# Patient Record
Sex: Female | Born: 1985 | Race: White | Hispanic: No | Smoking: Former smoker
Health system: Southern US, Community
[De-identification: ages and names within clinical notes are randomized; demographics above are authoritative.]

## PROBLEM LIST (undated history)

## (undated) DIAGNOSIS — D649 Anemia, unspecified: Secondary | ICD-10-CM

## (undated) DIAGNOSIS — Z87442 Personal history of urinary calculi: Secondary | ICD-10-CM

## (undated) DIAGNOSIS — R030 Elevated blood-pressure reading, without diagnosis of hypertension: Secondary | ICD-10-CM

## (undated) DIAGNOSIS — B019 Varicella without complication: Secondary | ICD-10-CM

## (undated) DIAGNOSIS — I10 Essential (primary) hypertension: Secondary | ICD-10-CM

## (undated) DIAGNOSIS — F32A Depression, unspecified: Secondary | ICD-10-CM

## (undated) DIAGNOSIS — R51 Headache: Secondary | ICD-10-CM

## (undated) DIAGNOSIS — N8500 Endometrial hyperplasia, unspecified: Secondary | ICD-10-CM

## (undated) DIAGNOSIS — K219 Gastro-esophageal reflux disease without esophagitis: Secondary | ICD-10-CM

## (undated) DIAGNOSIS — Z87440 Personal history of urinary (tract) infections: Secondary | ICD-10-CM

## (undated) DIAGNOSIS — O24419 Gestational diabetes mellitus in pregnancy, unspecified control: Secondary | ICD-10-CM

## (undated) DIAGNOSIS — E039 Hypothyroidism, unspecified: Secondary | ICD-10-CM

## (undated) DIAGNOSIS — E079 Disorder of thyroid, unspecified: Secondary | ICD-10-CM

## (undated) DIAGNOSIS — N939 Abnormal uterine and vaginal bleeding, unspecified: Secondary | ICD-10-CM

## (undated) DIAGNOSIS — R519 Headache, unspecified: Secondary | ICD-10-CM

## (undated) HISTORY — PX: CHOLECYSTECTOMY: SHX55

## (undated) HISTORY — DX: Gestational diabetes mellitus in pregnancy, unspecified control: O24.419

## (undated) HISTORY — PX: TONSILLECTOMY: SUR1361

## (undated) HISTORY — DX: Personal history of urinary (tract) infections: Z87.440

## (undated) HISTORY — DX: Disorder of thyroid, unspecified: E07.9

## (undated) HISTORY — DX: Headache, unspecified: R51.9

## (undated) HISTORY — DX: Varicella without complication: B01.9

## (undated) HISTORY — DX: Elevated blood-pressure reading, without diagnosis of hypertension: R03.0

## (undated) HISTORY — DX: Gastro-esophageal reflux disease without esophagitis: K21.9

## (undated) HISTORY — DX: Personal history of urinary calculi: Z87.442

## (undated) HISTORY — DX: Headache: R51

---
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
## 2010-08-19 ENCOUNTER — Emergency Department: Payer: Self-pay | Admitting: Emergency Medicine

## 2012-10-28 ENCOUNTER — Emergency Department: Payer: Self-pay | Admitting: Emergency Medicine

## 2012-10-28 LAB — URINALYSIS, COMPLETE
Bacteria: NONE SEEN
Leukocyte Esterase: NEGATIVE
Nitrite: NEGATIVE
Protein: 100
RBC,UR: 2530 /HPF (ref 0–5)
Specific Gravity: 1.026 (ref 1.003–1.030)
WBC UR: 25 /HPF (ref 0–5)

## 2012-10-28 LAB — CBC
HGB: 12.6 g/dL (ref 12.0–16.0)
MCHC: 33.4 g/dL (ref 32.0–36.0)
Platelet: 289 10*3/uL (ref 150–440)
WBC: 14.3 10*3/uL — ABNORMAL HIGH (ref 3.6–11.0)

## 2012-10-29 LAB — WET PREP, GENITAL

## 2013-03-13 ENCOUNTER — Observation Stay: Payer: Self-pay | Admitting: Obstetrics and Gynecology

## 2013-03-13 LAB — BASIC METABOLIC PANEL
Anion Gap: 10 (ref 7–16)
BUN: 7 mg/dL (ref 7–18)
Chloride: 107 mmol/L (ref 98–107)
Creatinine: 0.4 mg/dL — ABNORMAL LOW (ref 0.60–1.30)
EGFR (African American): >60
EGFR (Non-African Amer.): >60
Glucose: 74 mg/dL (ref 65–99)
Osmolality: 274 (ref 275–301)
Potassium: 3.7 mmol/L (ref 3.5–5.1)
Sodium: 139 mmol/L (ref 136–145)

## 2013-03-13 LAB — PROTEIN / CREATININE RATIO, URINE
Protein, Random Urine: 34 mg/dL — ABNORMAL HIGH (ref 0–12)
Protein/Creat. Ratio: 284 mg/gCREAT — ABNORMAL HIGH (ref 0–200)

## 2013-03-13 LAB — HEMOGLOBIN: HGB: 10.8 g/dL — ABNORMAL LOW (ref 12.0–16.0)

## 2013-03-13 LAB — ALT: SGPT (ALT): 17 U/L (ref 12–78)

## 2013-03-13 LAB — WET PREP, GENITAL

## 2013-04-16 ENCOUNTER — Inpatient Hospital Stay: Payer: Self-pay | Admitting: Obstetrics and Gynecology

## 2013-04-16 LAB — CBC WITH DIFFERENTIAL/PLATELET
Basophil #: 0 10*3/uL (ref 0.0–0.1)
Basophil %: 0.2 %
Eosinophil %: 0.8 %
Lymphocyte #: 1.7 10*3/uL (ref 1.0–3.6)
Monocyte #: 0.6 x10 3/mm (ref 0.2–0.9)
Monocyte %: 5.3 %
Neutrophil %: 79.9 %
Platelet: 220 10*3/uL (ref 150–440)
RBC: 3.83 10*6/uL (ref 3.80–5.20)
RDW: 14.5 % (ref 11.5–14.5)

## 2013-04-16 LAB — SGOT (AST)(ARMC): SGOT(AST): 28 U/L (ref 15–37)

## 2013-04-16 LAB — PROTEIN / CREATININE RATIO, URINE
Creatinine, Urine: 137.8 mg/dL — ABNORMAL HIGH (ref 30.0–125.0)
Protein, Random Urine: 64 mg/dL — ABNORMAL HIGH (ref 0–12)
Protein/Creat. Ratio: 464 mg/gCREAT — ABNORMAL HIGH (ref 0–200)

## 2013-04-16 LAB — BASIC METABOLIC PANEL
BUN: 6 mg/dL — ABNORMAL LOW (ref 7–18)
Calcium, Total: 8.8 mg/dL (ref 8.5–10.1)
Chloride: 107 mmol/L (ref 98–107)
Co2: 21 mmol/L (ref 21–32)
Creatinine: 0.46 mg/dL — ABNORMAL LOW (ref 0.60–1.30)
EGFR (African American): >60
EGFR (Non-African Amer.): >60

## 2013-04-16 LAB — LACTATE DEHYDROGENASE: LDH: 174 U/L (ref 81–246)

## 2013-04-16 LAB — URIC ACID: Uric Acid: 3.9 mg/dL (ref 2.6–6.0)

## 2013-04-17 LAB — HEMATOCRIT: HCT: 27.7 % — ABNORMAL LOW (ref 35.0–47.0)

## 2013-04-18 LAB — POTASSIUM: Potassium: 3.3 mmol/L — ABNORMAL LOW (ref 3.5–5.1)

## 2015-03-06 NOTE — H&P (Signed)
L&D Evaluation:  History:  HPI 29 yo G1 at 7349w2d gestational age by LMP consistent with 11 week ultrasound her pregnancy has been largely uncomplicated.  She presents with concern for leakage of fluid.  She noted a "gush" of fluid after urinating on Friday (two days ago). She had a second episode of leaking later that day.  She had a third episode of clear fluid yesterday.  She has had no leaking in between these episodes.  She notes no vaginal bleeding. she feels good positive fetal movement.  Notes occasional, infrequent cramping.  She denies headache, visual disturbances, and RUQ pain. O+ / VZEQ / RPR NR / RI / GBS unk/ HIV neg   Patient's Medical History GERD   Patient's Surgical History 1) cholecystectomy, 2) "kidney surgery", 3) tonsillectomy   Medications Pre Natal Vitamins  prilosec, ranitidine   Allergies NKDA   Social History none   Family History Non-Contributory   ROS:  ROS All systems were reviewed.  HEENT, CNS, GI, GU, Respiratory, CV, Renal and Musculoskeletal systems were found to be normal., unless noted in HPI   Exam:  Vital Signs BP 144/82, 125/64, 124/67, 155/75   General no apparent distress   Mental Status clear   Chest clear   Heart normal sinus rhythm   Abdomen gravid, non-tender   Back no CVAT   Edema no edema   Pelvic no external lesions, cervix closed and thick   Mebranes Intact   FHT normal rate with no decels   FHT Description 130/+ accels/ no decels/ mod var   Ucx irregular, 1 q 10 min   Other Nitrazine = equivocal, Pool = neg, Fern = neg ROM+ = neg   Impression:  Impression reactive NST, No evidence of PPROM   Plan:  Plan UA, EFM/NST, monitor BP   Comments 1) No evidence of rupture of membranes.  Continue with usual precautions 2) Elevated BPs: Will send home with 24 hour urine total protein collection to be collected tomorrow. Follow up for BP check and returning urine collection to assess protein.  Preeclampsia precautions  given.   Follow Up Appointment need to schedule. 2 days   Labs:  Lab Results:  Misc Urine Chem:  18-May-14 13:25   Creatinine, Urine 119.8  Protein, Random Urine  34  Protein/Creat Ratio (comp)  284 (Result(s) reported on 13 Mar 2013 at 02:53PM.)  Routine Hem:  18-May-14 14:10   RBC (CBC)  3.78 (Result(s) reported on 13 Mar 2013 at 02:54PM.)  Hemoglobin (CBC)  10.8 (Result(s) reported on 13 Mar 2013 at 02:54PM.)  Hematocrit (CBC)  30.6 (Result(s) reported on 13 Mar 2013 at 02:54PM.)  Platelet Count (CBC) 198 (Result(s) reported on 13 Mar 2013 at 02:54PM.)   Electronic Signatures: Conard NovakJackson,  D (MD)  (Signed 18-May-14 15:04)  Authored: L&D Evaluation, Labs   Last Updated: 18-May-14 15:04 by Conard NovakJackson,  D (MD)

## 2015-03-06 NOTE — H&P (Signed)
L&D Evaluation:  History:  HPI 29 yo G1 with EDC=04/29/2013 at 2738wk1d gestational age by LMP=07/23/2012 and consistent with 11 week ultrasound presents with onset contractions at   She notes no vaginal bleeding or LOF and  she feels good positive fetal movement. [facility] in [facility] first trimester and [facility], vaginal bleeding in the early second trimester and  an antomy scan revealing a LLP that later resolved.  O+ / VZEQ / RPR NR / RI / GBS negative/ HIV neg   Presents with contractions   Patient's Medical History GERD   Patient's Surgical History 1) cholecystectomy, 2) "kidney surgery", 3) tonsillectomy   Medications Nexium   Allergies NKDA   Social History none   Family History Non-Contributory   ROS:  ROS see HPI   Exam:  Vital Signs stable  135/85, 128/83   Urine Protein not completed   General no apparent distress   Mental Status clear   Chest clear   Heart normal sinus rhythm, no murmur/gallop/rubs   Abdomen gravid, tender with contractions   Estimated Fetal Weight Average for gestational age, 917-7 1/2#   Fetal Position vtx   Edema 1+   Reflexes 2+   Clonus negative   Pelvic no external lesions, 4/90%/-1 per RN exam   Mebranes Intact   FHT normal rate with no decels, 135 with accels to 150s to 170   FHT Description mod variability   Ucx q3-5   Skin dry   Impression:  Impression IUP at 38 1/7 weeks in early/ active labor. Reactive FHR tracing   Plan:  Plan EFM/NST, monitor contractions and for cervical change, Stadol for pain. Can ambulate/be on birthing ball and monitor intermittently   Electronic Signatures: Trinna BalloonGutierrez,  L (CNM)  (Signed 21-Jun-14 16:55)  Authored: L&D Evaluation   Last Updated: 21-Jun-14 16:55 by Trinna BalloonGutierrez,  L (CNM)

## 2016-08-12 LAB — CBC AND DIFFERENTIAL
HEMATOCRIT: 43 % (ref 36–46)
Hemoglobin: 14.7 g/dL (ref 12.0–16.0)
NEUTROS ABS: 9 /uL
Platelets: 344 10*3/uL (ref 150–399)
WBC: 13.1 10*3/mL

## 2016-08-12 LAB — LIPID PANEL
CHOLESTEROL: 211 mg/dL — AB (ref 0–200)
HDL: 40 mg/dL (ref 35–70)
LDL Cholesterol: 103 mg/dL
TRIGLYCERIDES: 341 mg/dL — AB (ref 40–160)

## 2016-08-12 LAB — TSH: TSH: 10.65 u[IU]/mL — AB (ref 0.41–5.90)

## 2016-09-24 ENCOUNTER — Encounter: Payer: Self-pay | Admitting: Family Medicine

## 2016-09-24 ENCOUNTER — Other Ambulatory Visit: Payer: Self-pay | Admitting: Family Medicine

## 2016-09-24 ENCOUNTER — Ambulatory Visit (INDEPENDENT_AMBULATORY_CARE_PROVIDER_SITE_OTHER): Payer: BLUE CROSS/BLUE SHIELD | Admitting: Family Medicine

## 2016-09-24 VITALS — BP 138/79 | HR 115 | Temp 98.3°F | Resp 14 | Ht 62.0 in | Wt 223.2 lb

## 2016-09-24 DIAGNOSIS — E782 Mixed hyperlipidemia: Secondary | ICD-10-CM

## 2016-09-24 DIAGNOSIS — E039 Hypothyroidism, unspecified: Secondary | ICD-10-CM | POA: Diagnosis not present

## 2016-09-24 DIAGNOSIS — K219 Gastro-esophageal reflux disease without esophagitis: Secondary | ICD-10-CM | POA: Diagnosis not present

## 2016-09-24 DIAGNOSIS — E785 Hyperlipidemia, unspecified: Secondary | ICD-10-CM | POA: Insufficient documentation

## 2016-09-24 DIAGNOSIS — R5382 Chronic fatigue, unspecified: Secondary | ICD-10-CM

## 2016-09-24 DIAGNOSIS — R5383 Other fatigue: Secondary | ICD-10-CM | POA: Insufficient documentation

## 2016-09-24 LAB — T3, FREE: T3, Free: 3.7 pg/mL (ref 2.3–4.2)

## 2016-09-24 LAB — T4, FREE: FREE T4: 0.47 ng/dL — AB (ref 0.60–1.60)

## 2016-09-24 LAB — TSH: TSH: 12.12 u[IU]/mL — ABNORMAL HIGH (ref 0.35–4.50)

## 2016-09-24 MED ORDER — LEVOTHYROXINE SODIUM 75 MCG PO TABS: 75.0000 ug | ORAL_TABLET | Freq: Every day | ORAL | 0 refills | Status: DC

## 2016-09-24 MED ORDER — PANTOPRAZOLE SODIUM 40 MG PO TBEC: 40.0000 mg | DELAYED_RELEASE_TABLET | Freq: Every day | ORAL | 1 refills | Status: DC

## 2016-09-24 NOTE — Assessment & Plan Note (Signed)
New problem. Starting Protonix.

## 2016-09-24 NOTE — Patient Instructions (Signed)
Take the medication as prescribed.  Follow up in 6 weeks.  Take care  Dr.   

## 2016-09-24 NOTE — Assessment & Plan Note (Signed)
New problem. Likely secondary to morbid obesity and hypothyroidism. Low risk given age. We'll continue to monitor closely.

## 2016-09-24 NOTE — Progress Notes (Signed)
Subjective:  Patient ID: Desiree Santos, female    DOB: Mar 23, 1986  Age: 30 y.o. MRN: [id number]  CC: Establish care, Fatigue, Abnormal labs  HPI Desiree Ovenatalie D Apuzzo is a 30 y.o. female presents to the clinic today to establish care. Issues are below.  Fatigue  Patient has had ongoing, chronic fatigue.   She reports low energy.   She states that when she gets home from work she is not interested in doing anything. She does not even want to play with her son.   She denies symptoms of depression.  No known etiology per patient.  She reports physical inactivity and weight gain.   No known exacerbating or relieving factors.  GERD  Severe.  Associated vomiting.  Taking Prilosec without significant improvement.  No red flags.  No weight loss.  Would like to discuss treatment options today.   Abnormal labs, weight gain  Patient recently had laboratory studies done by her OB/GYN.  TSH was elevated at 10.65.  She returned endorses ongoing weight gain since birth of her child.  She was sent to me for further evaluation and treatment.  Hyperlipidemia  Noted on recent labs.  Currently untreated.  PMH, Surgical Hx, Family Hx, Social History reviewed and updated as below.  Past Medical History:  Diagnosis Date  . Chicken pox   . Elevated BP without diagnosis of hypertension   . GERD (gastroesophageal reflux disease)   . History of kidney stones   . History of UTI    Past Surgical History:  Procedure Laterality Date  . CHOLECYSTECTOMY    . TONSILLECTOMY      Family History  Problem Relation Age of Onset  . Hyperlipidemia Mother   . Hypertension Mother   . Heart disease Father   . Sudden Cardiac Death Father   . Hypertension Maternal Grandfather   . Breast cancer Paternal Grandmother   . Diabetes Paternal Grandfather     Social History  Substance Use Topics  . Smoking status: Current Every Day Smoker  . Smokeless tobacco: Never Used  . Alcohol use  No   Review of Systems  Constitutional: Positive for fatigue.       Weight gain.  Gastrointestinal: Positive for vomiting.       GERD.  Neurological: Positive for headaches.  All other systems reviewed and are negative.  Objective:   Today's Vitals: BP 138/79 (BP Location: Left Arm, Patient Position: Sitting, Cuff Size: Large)   Pulse (!) 115   Temp 98.3 F (36.8 C) (Oral)   Resp 14   Ht 5\' 2"  (1.575 m) Comment: with shoes  Wt 223 lb 4 oz (101.3 kg)   SpO2 97%   BMI 40.83 kg/m   Physical Exam  Constitutional: She is oriented to person, place, and time.  Obese female in NAD.  HENT:  Head: Normocephalic and atraumatic.  Mouth/Throat: Oropharynx is clear and moist.  Eyes: Conjunctivae are normal. No scleral icterus.  Neck: Neck supple. No thyromegaly present.  Cardiovascular: Regular rhythm.  Tachycardia present.   Pulmonary/Chest: Effort normal. She has no wheezes. She has no rales.  Abdominal: Soft. She exhibits no distension. There is no tenderness. There is no rebound and no guarding.  Musculoskeletal: Normal range of motion.  Neurological: She is alert and oriented to person, place, and time.  Skin: Skin is warm and dry. No rash noted.  Psychiatric: She has a normal mood and affect.  Vitals reviewed.  Assessment & Plan:   Problem List Items Addressed  This Visit    Hypothyroidism    New problem. TSH and free T3/free T4 today. If these laboratory studies are consistent with hypothyroidism, we'll proceed with treatment with Synthroid.      Relevant Orders   TSH   T4, free   T3, free   HLD (hyperlipidemia)    New problem. Likely secondary to morbid obesity and hypothyroidism. Low risk given age. We'll continue to monitor closely.      Gastroesophageal reflux disease without esophagitis    New problem. Starting Protonix.      Relevant Medications   pantoprazole (PROTONIX) 40 MG tablet   Fatigue - Primary    New problem. Likely secondary to morbid  obesity and hypothyroidism. Obtaining TSH, free T3, free T4 today.         Outpatient Encounter Prescriptions as of 09/24/2016  Medication Sig  . pantoprazole (PROTONIX) 40 MG tablet Take 1 tablet (40 mg total) by mouth daily.   No facility-administered encounter medications on file as of 09/24/2016.     Follow-up: Return in about 6 weeks (around 11/05/2016).  Everlene OtherJayce  DO [facility] [place]

## 2016-09-24 NOTE — Assessment & Plan Note (Signed)
New problem. TSH and free T3/free T4 today. If these laboratory studies are consistent with hypothyroidism, we'll proceed with treatment with Synthroid.

## 2016-09-24 NOTE — Assessment & Plan Note (Signed)
New problem. Likely secondary to morbid obesity and hypothyroidism. Obtaining TSH, free T3, free T4 today.

## 2016-11-05 ENCOUNTER — Other Ambulatory Visit: Payer: Self-pay | Admitting: Family Medicine

## 2016-11-05 ENCOUNTER — Encounter: Payer: Self-pay | Admitting: Family Medicine

## 2016-11-05 ENCOUNTER — Ambulatory Visit (INDEPENDENT_AMBULATORY_CARE_PROVIDER_SITE_OTHER): Payer: BLUE CROSS/BLUE SHIELD | Admitting: Family Medicine

## 2016-11-05 VITALS — BP 134/86 | HR 81 | Temp 98.5°F | Resp 14 | Wt 224.0 lb

## 2016-11-05 DIAGNOSIS — E039 Hypothyroidism, unspecified: Secondary | ICD-10-CM | POA: Diagnosis not present

## 2016-11-05 DIAGNOSIS — Z6841 Body Mass Index (BMI) 40.0 and over, adult: Secondary | ICD-10-CM

## 2016-11-05 DIAGNOSIS — R5382 Chronic fatigue, unspecified: Secondary | ICD-10-CM

## 2016-11-05 LAB — LDL CHOLESTEROL, DIRECT: Direct LDL: 123 mg/dL

## 2016-11-05 LAB — COMPREHENSIVE METABOLIC PANEL
ALK PHOS: 61 U/L (ref 39–117)
ALT: 33 U/L (ref 0–35)
AST: 36 U/L (ref 0–37)
Albumin: 4.5 g/dL (ref 3.5–5.2)
BUN: 12 mg/dL (ref 6–23)
CALCIUM: 9.8 mg/dL (ref 8.4–10.5)
CHLORIDE: 101 meq/L (ref 96–112)
CO2: 24 mEq/L (ref 19–32)
CREATININE: 0.55 mg/dL (ref 0.40–1.20)
GFR: 137.02 mL/min (ref >60.00)
Glucose, Bld: 88 mg/dL (ref 70–99)
POTASSIUM: 4.7 meq/L (ref 3.5–5.1)
Sodium: 139 mEq/L (ref 135–145)
TOTAL PROTEIN: 7.3 g/dL (ref 6.0–8.3)
Total Bilirubin: 0.4 mg/dL (ref 0.2–1.2)

## 2016-11-05 LAB — LIPID PANEL
CHOL/HDL RATIO: 5
CHOLESTEROL: 219 mg/dL — AB (ref 0–200)
HDL: 45.4 mg/dL (ref >39.00)
NonHDL: 173.47
TRIGLYCERIDES: 315 mg/dL — AB (ref 0.0–149.0)
VLDL: 63 mg/dL — ABNORMAL HIGH (ref 0.0–40.0)

## 2016-11-05 LAB — CBC
HEMATOCRIT: 41.1 % (ref 36.0–46.0)
Hemoglobin: 14.1 g/dL (ref 12.0–15.0)
MCHC: 34.2 g/dL (ref 30.0–36.0)
MCV: 88.3 fl (ref 78.0–100.0)
Platelets: 282 10*3/uL (ref 150.0–400.0)
RBC: 4.66 Mil/uL (ref 3.87–5.11)
RDW: 13.3 % (ref 11.5–15.5)
WBC: 11.3 10*3/uL — AB (ref 4.0–10.5)

## 2016-11-05 LAB — HEMOGLOBIN A1C: HEMOGLOBIN A1C: 5.6 % (ref 4.6–6.5)

## 2016-11-05 LAB — TSH: TSH: 9.99 u[IU]/mL — ABNORMAL HIGH (ref 0.35–4.50)

## 2016-11-05 MED ORDER — LEVOTHYROXINE SODIUM 100 MCG PO TABS: 100.0000 ug | ORAL_TABLET | Freq: Every day | ORAL | 0 refills | Status: DC

## 2016-11-05 NOTE — Assessment & Plan Note (Signed)
Not well controlled. TSH today. Will titrate synthroid based on TSH.

## 2016-11-05 NOTE — Progress Notes (Signed)
Subjective:  Patient ID: Desiree Santos, female    DOB: [birth year]  Age: 31 y.o. MRN: [id number]  CC: Follow up  HPI:  31 year old female with morbid obesity, chronic fatigue, GERD, hypothyroidism presents for follow-up.  Hypothyroidism  Endorses compliance with Synthroid.  She has not noticed any improvement following treatment.  Needs TSH today.  Chronic fatigue  Continues to have severe, chronic fatigue. Worsening.  She states that she has a history of postpartum depression but does not feel depressed.  She states that she does not sleep well as she wakes up frequently at night to urinate. She does snore.  She has not noticed any improvement in her fatigue since being treated for hypothyroidism.  She has gained a significant amount of weight and is physically inactive.  Will discuss further workup and etiologies today.  Social Hx   Social History   Social History  . Marital status: Single    Spouse name: N/A  . Number of children: N/A  . Years of education: N/A   Social History Main Topics  . Smoking status: Current Every Day Smoker  . Smokeless tobacco: Never Used  . Alcohol use No  . Drug use: No  . Sexual activity: Yes    Partners: Male   Other Topics Concern  . None   Social History Narrative  . None    Review of Systems  Constitutional: Positive for fatigue.  Respiratory: Negative.   Cardiovascular: Negative.    Objective:  BP 134/86   Pulse 81   Temp 98.5 F (36.9 C) (Oral)   Resp 14   Wt 224 lb (101.6 kg)   SpO2 97%   BMI 40.97 kg/m   BP/Weight 11/05/2016 09/24/2016  Systolic BP 134 138  Diastolic BP 86 79  Wt. (Lbs) 224 223.25  BMI 40.97 40.83    Physical Exam  Constitutional: She is oriented to person, place, and time. She appears well-developed. No distress.  Cardiovascular: Normal rate and regular rhythm.   Pulmonary/Chest: Effort normal and breath sounds normal.  Neurological: She is alert and oriented to person,  place, and time.  Psychiatric: She has a normal mood and affect.  Vitals reviewed.   Lab Results  Component Value Date   WBC 13.1 08/12/2016   HGB 14.7 08/12/2016   HCT 43 08/12/2016   PLT 344 08/12/2016   GLUCOSE 77 04/16/2013   CHOL 211 (A) 08/12/2016   TRIG 341 (A) 08/12/2016   HDL 40 08/12/2016   LDLCALC 103 08/12/2016   ALT 17 03/13/2013   AST 28 04/16/2013   NA 142 04/16/2013   K 3.3 (L) 04/18/2013   CL 107 04/16/2013   CREATININE 0.46 (L) 04/16/2013   BUN 6 (L) 04/16/2013   CO2 21 04/16/2013   TSH 12.12 (H) 09/24/2016    Assessment & Plan:   Problem List Items Addressed This Visit    Morbid obesity with BMI of 40.0-44.9, adult (HCC)   Relevant Orders   Lipid panel   Hypothyroidism - Primary    Not well controlled. TSH today. Will titrate synthroid based on TSH.      Relevant Orders   TSH   Fatigue    Established problem, worsening. DDX - Hypothyroidism, Depression, OSA, Deconditioning/Morbid obesity. Obtaining labs today. Will likely need sleep study if labs are stable/hypothyroidism controlled. Advised healthy diet and daily physical activity.      Relevant Orders   CBC   Hemoglobin A1c   Comprehensive metabolic panel  Follow-up: Pending labs   [facility] DO [facility]

## 2016-11-05 NOTE — Assessment & Plan Note (Signed)
Established problem, worsening. DDX - Hypothyroidism, Depression, OSA, Deconditioning/Morbid obesity. Obtaining labs today. Will likely need sleep study if labs are stable/hypothyroidism controlled. Advised healthy diet and daily physical activity.

## 2016-11-05 NOTE — Patient Instructions (Signed)
We will call with your lab results.  Follow up pending your lab results.  Dietary changes and exercise as we discussed.  Take care  Dr. Adriana Simasook

## 2016-11-05 NOTE — Progress Notes (Signed)
Pre visit review using our clinic review tool, if applicable. No additional management support is needed unless otherwise documented below in the visit note. 

## 2016-11-06 ENCOUNTER — Telehealth: Payer: Self-pay | Admitting: Family Medicine

## 2016-11-06 NOTE — Telephone Encounter (Signed)
Pt called and stated that she had a question for you. Thank you!  Call pt @ [phone]

## 2016-11-06 NOTE — Telephone Encounter (Signed)
Likely related to weight and lack of activity. Elevation and physical activity.

## 2016-11-06 NOTE — Telephone Encounter (Signed)
Pt was called and given Dr.Cook's comments. Pt gave verbal understanding.

## 2016-11-06 NOTE — Telephone Encounter (Signed)
Pt stated that her legs are swelling and staying indented when she pushes down on them.

## 2017-02-03 ENCOUNTER — Encounter: Payer: Self-pay | Admitting: Family Medicine

## 2017-02-03 ENCOUNTER — Ambulatory Visit (INDEPENDENT_AMBULATORY_CARE_PROVIDER_SITE_OTHER): Payer: BLUE CROSS/BLUE SHIELD | Admitting: Family Medicine

## 2017-02-03 VITALS — BP 124/80 | HR 84 | Temp 97.8°F | Wt 226.4 lb

## 2017-02-03 DIAGNOSIS — R51 Headache: Secondary | ICD-10-CM | POA: Diagnosis not present

## 2017-02-03 DIAGNOSIS — R519 Headache, unspecified: Secondary | ICD-10-CM

## 2017-02-03 MED ORDER — DEXAMETHASONE SODIUM PHOSPHATE 10 MG/ML IJ SOLN
10.0000 mg | Freq: Once | INTRAMUSCULAR | Status: AC
  Administered 2017-02-03: 10 mg via INTRAMUSCULAR

## 2017-02-03 NOTE — Assessment & Plan Note (Signed)
New problem. Concern for pseudotumor cerebri given history, age, obesity. Treating with IM Decadron today and sending to neuro for eval.

## 2017-02-03 NOTE — Patient Instructions (Signed)
We will call with neuro referral.  Take care  Dr. Adriana Simas

## 2017-02-03 NOTE — Progress Notes (Signed)
Pre visit review using our clinic review tool, if applicable. No additional management support is needed unless otherwise documented below in the visit note. 

## 2017-02-03 NOTE — Progress Notes (Signed)
   Subjective:  Patient ID: Desiree Santos, female    DOB: [birth year]  Age: 31 y.o. MRN: [id number]  CC: Headache  HPI:  31 year old female presents with complaints of headache.  Patient reports she's had several severe headaches over the past 2 months. Patient states that her headaches are located in the temporal and frontal regions. Bilateral. Described as pulsing/throbbing. No associated photophobia. She does report some visual disturbance. She states that she feels that her left eye is not moving appropriately. She reports associated nausea. No vomiting. Headaches start mild and then progressed to severe, 10/10. She's been using ibuprofen with no improvement. No history of migraine. She states that she has never had this previously.  Social Hx   Social History   Social History  . Marital status: Single    Spouse name: N/A  . Number of children: N/A  . Years of education: N/A   Social History Main Topics  . Smoking status: Current Every Day Smoker  . Smokeless tobacco: Never Used  . Alcohol use No  . Drug use: No  . Sexual activity: Yes    Partners: Male   Other Topics Concern  . None   Social History Narrative  . None    Review of Systems  Eyes: Positive for visual disturbance.  Neurological: Positive for headaches.   Objective:  BP 124/80   Pulse 84   Temp 97.8 F (36.6 C) (Oral)   Wt 226 lb 6 oz (102.7 kg)   SpO2 99%   BMI 41.40 kg/m   BP/Weight 02/03/2017 11/05/2016 09/24/2016  Systolic BP 124 134 138  Diastolic BP 80 86 79  Wt. (Lbs) 226.38 224 223.25  BMI 41.4 40.97 40.83   Physical Exam  Constitutional: She is oriented to person, place, and time. She appears well-developed. No distress.  HENT:  Head: Normocephalic and atraumatic.  Eyes: Conjunctivae are normal.  I could not appreciate any papilledema on fundoscopic edema.   Cardiovascular: Normal rate and regular rhythm.   Neurological: She is alert and oriented to person, place, and time. No  cranial nerve deficit.  Normal muscle strength.  Psychiatric: She has a normal mood and affect.  Vitals reviewed.   Lab Results  Component Value Date   WBC 11.3 (H) 11/05/2016   HGB 14.1 11/05/2016   HCT 41.1 11/05/2016   PLT 282.0 11/05/2016   GLUCOSE 88 11/05/2016   CHOL 219 (H) 11/05/2016   TRIG 315.0 (H) 11/05/2016   HDL 45.40 11/05/2016   LDLDIRECT 123.0 11/05/2016   LDLCALC 103 08/12/2016   ALT 33 11/05/2016   AST 36 11/05/2016   NA 139 11/05/2016   K 4.7 11/05/2016   CL 101 11/05/2016   CREATININE 0.55 11/05/2016   BUN 12 11/05/2016   CO2 24 11/05/2016   TSH 9.99 (H) 11/05/2016   HGBA1C 5.6 11/05/2016    Assessment & Plan:   Problem List Items Addressed This Visit    Nonintractable headache - Primary    New problem. Concern for pseudotumor cerebri given history, age, obesity. Treating with IM Decadron today and sending to neuro for eval.       Relevant Medications   dexamethasone (DECADRON) injection 10 mg (Completed)     Meds ordered this encounter  Medications  . dexamethasone (DECADRON) injection 10 mg   Follow-up: PRN  Everlene Other DO [facility]

## 2017-02-05 ENCOUNTER — Ambulatory Visit (INDEPENDENT_AMBULATORY_CARE_PROVIDER_SITE_OTHER): Payer: BLUE CROSS/BLUE SHIELD | Admitting: Neurology

## 2017-02-05 ENCOUNTER — Encounter: Payer: Self-pay | Admitting: Neurology

## 2017-02-05 DIAGNOSIS — G43709 Chronic migraine without aura, not intractable, without status migrainosus: Secondary | ICD-10-CM | POA: Insufficient documentation

## 2017-02-05 DIAGNOSIS — IMO0002 Reserved for concepts with insufficient information to code with codable children: Secondary | ICD-10-CM

## 2017-02-05 MED ORDER — SUMATRIPTAN SUCCINATE 100 MG PO TABS: 100.0000 mg | ORAL_TABLET | Freq: Once | ORAL | 6 refills | Status: DC | PRN

## 2017-02-05 MED ORDER — PROMETHAZINE HCL 25 MG PO TABS: 25.0000 mg | ORAL_TABLET | Freq: Four times a day (QID) | ORAL | 6 refills | Status: DC | PRN

## 2017-02-05 NOTE — Progress Notes (Signed)
PATIENT: Desiree Santos DOB: 12-23-1985  Chief Complaint  Patient presents with  . Headache    She is here with her mother, Toniann Fail.  Reports onset of severe headaches two months ago.  Pain can last up to 4-5 days.  At times, she has nausea and vision disturbance in her left eye.  She is using ibuprofen without relief.   Marland Kitchen PCP    Tommie Sams, DO     HISTORICAL  Desiree Santos is a 31 years old right-handed female, accompanied by her mother when the, seen in refer by her primary care physician Dr. Tommie Sams for evaluation of headache, initial evaluation was on February 05 2017.  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 She has 11 years old son.  She noticed increased headaches since February 2018, bilateral frontal retro-orbital area severe pounding headaches, with associated light noise sensitivity, nauseous, she had 4 major headaches since February, oftentimes preceded by left visual distortion, lasting for a few minutes,  She did had a 60 pound weight gain over the past 3 years, she denies significant visual change, previously around her menstruation, she would have similar but much milder shorter lasting headaches   REVIEW OF SYSTEMS: Full 14 system review of systems performed and notable only for headache, high pain  ALLERGIES: No Known Allergies  HOME MEDICATIONS: Current Outpatient Prescriptions  Medication Sig Dispense Refill  . levothyroxine (SYNTHROID, LEVOTHROID) 100 MCG tablet Take 1 tablet (100 mcg total) by mouth daily. 60 tablet 0  . pantoprazole (PROTONIX) 40 MG tablet Take 1 tablet (40 mg total) by mouth daily. 90 tablet 1   No  current facility-administered medications for this visit.     PAST MEDICAL HISTORY: Past Medical History:  Diagnosis Date  . Chicken pox   . Elevated BP without diagnosis of hypertension   . GERD (gastroesophageal reflux disease)   . Headache   . History of kidney stones   . History of UTI   . Thyroid disease     PAST SURGICAL HISTORY: Past Surgical History:  Procedure Laterality Date  . CHOLECYSTECTOMY    . KIDNEY SURGERY  2002  . TONSILLECTOMY      FAMILY HISTORY: Family History  Problem Relation Age of Onset  . Hyperlipidemia Mother   . Hypertension Mother   . Heart disease Father   . Sudden Cardiac Death Father   . Hypertension Maternal Grandfather   . Breast cancer Paternal Grandmother   . Diabetes Paternal Grandfather   . Dementia Maternal Grandmother     SOCIAL HISTORY:  Social History   Social History  . Marital status: Single    Spouse name: N/A  . Number of children: 1  . Years of education: HS   Occupational History  . Lab [employer]   Social History Main Topics  . Smoking status: Current Every Day Smoker    Types: Cigarettes  . Smokeless tobacco: Never Used     Comment: 1-3 cigarettes per day  . Alcohol use No  . Drug use: No  . Sexual activity: Yes    Partners: Male   Other Topics Concern  . Not on file  Social History Narrative   Lives at home with child.   Right-handed.   Occasional caffeine use.     PHYSICAL EXAM   Vitals:   02/05/17 0825  Weight: 223 lb 12 oz (101.5 kg)  Height:  (1.575 m)    Not recorded      Body mass index is 40.92 kg/m.  PHYSICAL EXAMNIATION:  Gen: NAD, conversant, well nourised, obese, well groomed                     Cardiovascular: Regular rate rhythm, no peripheral edema, warm, nontender. Eyes: Conjunctivae clear without exudates or hemorrhage Neck: Supple, no carotid bruits. Pulmonary: Clear to auscultation bilaterally   NEUROLOGICAL EXAM:  MENTAL  STATUS: Speech:    Speech is normal; fluent and spontaneous with normal comprehension.  Cognition:     Orientation to time, place and person     Normal recent and remote memory     Normal Attention span and concentration     Normal Language, naming, repeating,spontaneous speech     Fund of knowledge   CRANIAL NERVES: CN II: Visual fields are full to confrontation. Fundoscopic exam is normal with sharp discs and no vascular changes. Pupils are round equal and briskly reactive to light. CN III, IV, VI: extraocular movement are normal. No ptosis. CN V: Facial sensation is intact to pinprick in all 3 divisions bilaterally. Corneal responses are intact.  CN VII: Face is symmetric with normal eye closure and smile. CN VIII: Hearing is normal to rubbing fingers CN IX, X: Palate elevates symmetrically. Phonation is normal. CN XI: Head turning and shoulder shrug are intact CN XII: Tongue is midline with normal movements and no atrophy.  MOTOR: There is no pronator drift of out-stretched arms. Muscle bulk and tone are normal. Muscle strength is normal.  REFLEXES: Reflexes are 2+ and symmetric at the biceps, triceps, knees, and ankles. Plantar responses are flexor.  SENSORY: Intact to light touch, pinprick, positional sensation and vibratory sensation are intact in fingers and toes.  COORDINATION: Rapid alternating movements and fine finger movements are intact. There is no dysmetria on finger-to-nose and heel-knee-shin.    GAIT/STANCE: Posture is normal. Gait is steady with normal steps, base, arm swing, and turning. Heel and toe walking are normal. Tandem gait is normal.  Romberg is absent.   DIAGNOSTIC DATA (LABS, IMAGING, TESTING) - I reviewed patient records, labs, notes, testing and imaging myself where available.   ASSESSMENT AND PLAN  Desiree Santos is a 31 y.o. female   Chronic migraine  Imitrex 100 mg, plus Phenergan as needed  Levert Feinstein, M.D. Ph.D.  [facility] [address], [address] [place], [place] [address] Ph: [phone] Fax: [phone]  CC: Tommie Sams, DO

## 2017-05-21 ENCOUNTER — Ambulatory Visit: Payer: BLUE CROSS/BLUE SHIELD | Admitting: Neurology

## 2019-11-15 ENCOUNTER — Emergency Department: Payer: BC Managed Care – PPO

## 2019-11-15 ENCOUNTER — Other Ambulatory Visit: Payer: Self-pay

## 2019-11-15 ENCOUNTER — Emergency Department
Admission: EM | Admit: 2019-11-15 | Discharge: 2019-11-15 | Disposition: A | Discharge status: Home / Self Care | Payer: BC Managed Care – PPO | Attending: Student | Admitting: Student

## 2019-11-15 ENCOUNTER — Encounter: Payer: Self-pay | Admitting: Emergency Medicine

## 2019-11-15 DIAGNOSIS — Z87891 Personal history of nicotine dependence: Secondary | ICD-10-CM | POA: Diagnosis not present

## 2019-11-15 DIAGNOSIS — E039 Hypothyroidism, unspecified: Secondary | ICD-10-CM | POA: Diagnosis not present

## 2019-11-15 DIAGNOSIS — Z79899 Other long term (current) drug therapy: Secondary | ICD-10-CM | POA: Diagnosis not present

## 2019-11-15 DIAGNOSIS — R220 Localized swelling, mass and lump, head: Secondary | ICD-10-CM | POA: Diagnosis present

## 2019-11-15 DIAGNOSIS — J3489 Other specified disorders of nose and nasal sinuses: Secondary | ICD-10-CM | POA: Insufficient documentation

## 2019-11-15 DIAGNOSIS — L03211 Cellulitis of face: Secondary | ICD-10-CM

## 2019-11-15 LAB — CBC WITH DIFFERENTIAL/PLATELET
Abs Immature Granulocytes: 0.07 10*3/uL (ref 0.00–0.07)
Basophils Absolute: 0 10*3/uL (ref 0.0–0.1)
Basophils Relative: 1 %
Eosinophils Absolute: 0.2 10*3/uL (ref 0.0–0.5)
Eosinophils Relative: 3 %
HCT: 38.2 % (ref 36.0–46.0)
Hemoglobin: 13 g/dL (ref 12.0–15.0)
Immature Granulocytes: 1 %
Lymphocytes Relative: 32 %
Lymphs Abs: 2.6 10*3/uL (ref 0.7–4.0)
MCH: 29.2 pg (ref 26.0–34.0)
MCHC: 34 g/dL (ref 30.0–36.0)
MCV: 85.8 fL (ref 80.0–100.0)
Monocytes Absolute: 0.4 10*3/uL (ref 0.1–1.0)
Monocytes Relative: 5 %
Neutro Abs: 4.8 10*3/uL (ref 1.7–7.7)
Neutrophils Relative %: 58 %
Platelets: 209 10*3/uL (ref 150–400)
RBC: 4.45 MIL/uL (ref 3.87–5.11)
RDW: 12.7 % (ref 11.5–15.5)
WBC: 8.1 10*3/uL (ref 4.0–10.5)
nRBC: 0 % (ref 0.0–0.2)

## 2019-11-15 LAB — BASIC METABOLIC PANEL
Anion gap: 11 (ref 5–15)
BUN: 13 mg/dL (ref 6–20)
CO2: 24 mmol/L (ref 22–32)
Calcium: 9.4 mg/dL (ref 8.9–10.3)
Chloride: 103 mmol/L (ref 98–111)
Creatinine, Ser: 0.7 mg/dL (ref 0.44–1.00)
GFR calc Af Amer: >60 mL/min (ref >60)
GFR calc non Af Amer: >60 mL/min (ref >60)
Glucose, Bld: 89 mg/dL (ref 70–99)
Potassium: 4 mmol/L (ref 3.5–5.1)
Sodium: 138 mmol/L (ref 135–145)

## 2019-11-15 LAB — POCT PREGNANCY, URINE: Preg Test, Ur: NEGATIVE

## 2019-11-15 MED ORDER — CETIRIZINE HCL 10 MG PO TABS: 10.0000 mg | ORAL_TABLET | Freq: Every day | ORAL | 0 refills | Status: DC

## 2019-11-15 MED ORDER — DIPHENHYDRAMINE HCL 50 MG/ML IJ SOLN
25.0000 mg | Freq: Once | INTRAMUSCULAR | Status: AC
  Administered 2019-11-15: 20:00:00 25 mg via INTRAVENOUS
  Filled 2019-11-15: qty 1

## 2019-11-15 MED ORDER — GADOBUTROL 1 MMOL/ML IV SOLN
10.0000 mL | Freq: Once | INTRAVENOUS | Status: AC | PRN
  Administered 2019-11-15: 22:00:00 10 mL via INTRAVENOUS
  Filled 2019-11-15: qty 10

## 2019-11-15 MED ORDER — METHYLPREDNISOLONE SODIUM SUCC 125 MG IJ SOLR
125.0000 mg | Freq: Once | INTRAMUSCULAR | Status: AC
  Administered 2019-11-15: 20:00:00 125 mg via INTRAVENOUS
  Filled 2019-11-15: qty 2

## 2019-11-15 MED ORDER — ACETAMINOPHEN 500 MG PO TABS
1000.0000 mg | ORAL_TABLET | Freq: Once | ORAL | Status: AC
  Administered 2019-11-15: 20:00:00 1000 mg via ORAL
  Filled 2019-11-15: qty 2

## 2019-11-15 MED ORDER — SODIUM CHLORIDE 0.9 % IV BOLUS
1000.0000 mL | Freq: Once | INTRAVENOUS | Status: AC
  Administered 2019-11-15: 1000 mL via INTRAVENOUS

## 2019-11-15 MED ORDER — FENTANYL CITRATE (PF) 100 MCG/2ML IJ SOLN
25.0000 ug | Freq: Once | INTRAMUSCULAR | Status: AC
  Administered 2019-11-15: 20:00:00 25 ug via INTRAVENOUS
  Filled 2019-11-15: qty 2

## 2019-11-15 MED ORDER — FAMOTIDINE IN NACL 20-0.9 MG/50ML-% IV SOLN
20.0000 mg | Freq: Once | INTRAVENOUS | Status: AC
  Administered 2019-11-15: 20:00:00 20 mg via INTRAVENOUS
  Filled 2019-11-15: qty 50

## 2019-11-15 MED ORDER — DOXYCYCLINE HYCLATE 100 MG PO CAPS: 100.0000 mg | ORAL_CAPSULE | Freq: Two times a day (BID) | ORAL | 0 refills | Status: AC

## 2019-11-15 MED ORDER — IOHEXOL 300 MG/ML  SOLN
75.0000 mL | Freq: Once | INTRAMUSCULAR | Status: AC | PRN
  Administered 2019-11-15: 75 mL via INTRAVENOUS
  Filled 2019-11-15: qty 75

## 2019-11-15 NOTE — ED Triage Notes (Signed)
FIRST NURSE NOTE- here for facial swelling. Small scabbed wound to forehead. NAD

## 2019-11-15 NOTE — ED Provider Notes (Signed)
[facility] Emergency Department Provider Note  ____________________________________________   First MD Initiated Contact with Patient 11/15/19 1708     (approximate)  I have reviewed the triage vital signs and the nursing notes.  History  Chief Complaint Cellulitis    HPI Desiree Santos is a 34 y.o. female who presents emergency department for an area of scabbing and redness to her forehead, accompanied by facial and neck swelling.  Symptoms initially started about 1 week ago and were limited to a small area of suspected cellulitis located between her eyebrows. Started as a "dry patch" nickel sized area, then developed redness/scabbing, which has now progressed slightly in size.  The central portion is dark and scabbed over. She denies any trauma or picking to the area. 1 or 2 days after developing this area of infection she developed submandibular swelling.  No associated erythema or warmth to this area.  No difficulty speaking, swallowing, or breathing.  Thereafter, she developed bilateral periorbital edema and swelling, mild in severity.   She saw her PCP for these symptoms yesterday, was started on Keflex and a topical medication.  She specifically clarifies that her facial swelling was present prior to initiating this topical medication.  She denies any new facial contact exposures that she knows of.  She reports an associated aching headache (moderate in severity, no radiation, no alleviating/aggravating components). No blurry vision or pain with eye movement.  No history of similar symptoms.   Past Medical Hx Past Medical History:  Diagnosis Date  . Chicken pox   . Elevated BP without diagnosis of hypertension   . GERD (gastroesophageal reflux disease)   . Headache   . History of kidney stones   . History of UTI   . Thyroid disease     Problem List Patient Active Problem List   Diagnosis Date Noted  . Chronic migraine 02/05/2017  .  Nonintractable headache 02/03/2017  . Morbid obesity with BMI of 40.0-44.9, adult ([place]) 11/05/2016  . Fatigue 09/24/2016  . Hypothyroidism 09/24/2016  . Gastroesophageal reflux disease without esophagitis 09/24/2016  . HLD (hyperlipidemia) 09/24/2016    Past Surgical Hx Past Surgical History:  Procedure Laterality Date  . CHOLECYSTECTOMY    . KIDNEY SURGERY  2002  . TONSILLECTOMY      Medications Prior to Admission medications   Medication Sig Start Date End Date Taking? Authorizing Provider  levothyroxine (SYNTHROID, LEVOTHROID) 100 MCG tablet Take 1 tablet (100 mcg total) by mouth daily. 11/05/16   Coral Spikes, DO  pantoprazole (PROTONIX) 40 MG tablet Take 1 tablet (40 mg total) by mouth daily. 09/24/16   Coral Spikes, DO    Allergies Patient has no known allergies.  Family Hx Family History  Problem Relation Age of Onset  . Hyperlipidemia Mother   . Hypertension Mother   . Heart disease Father   . Sudden Cardiac Death Father   . Hypertension Maternal Grandfather   . Breast cancer Paternal Grandmother   . Diabetes Paternal Grandfather   . Dementia Maternal Grandmother     Social Hx Social History   Tobacco Use  . Smoking status: Former Smoker    Types: Cigarettes  . Smokeless tobacco: Never Used  Substance Use Topics  . Alcohol use: No  . Drug use: No     Review of Systems  Constitutional: Negative for fever, chills. Eyes: Negative for visual changes. HENT: + facial swelling Cardiovascular: Negative for chest pain. Respiratory: Negative for shortness of breath. Gastrointestinal: Negative  for nausea, vomiting.  Genitourinary: Negative for dysuria. Musculoskeletal: Negative for leg swelling. Skin: + skin lesions, cellulitis Neurological: Negative for for headaches.   Physical Exam  Vital Signs: ED Triage Vitals  Enc Vitals Group     BP 11/15/19 1620 127/84     Pulse Rate 11/15/19 1620 74     Resp 11/15/19 1620 17     Temp 11/15/19 1620 98.6  F (37 C)     Temp Source 11/15/19 1620 Oral     SpO2 11/15/19 1620 96 %     Weight 11/15/19 1620 218 lb (98.9 kg)     Height 11/15/19 1620 5' (1.524 m)     Head Circumference --      Peak Flow --      Pain Score 11/15/19 1628 10     Pain Loc --      Pain Edu? --      Excl. in GC? --     Constitutional: Alert and oriented.  Head/Face: Nickel sized area of erythema/irritation between the eyebrows. Central area is darkened and scabbed. Preauricular/parotid lymphadenopathy. CN II-XII in tact.  Eyes: Conjunctivae clear. Sclera anicteric.  Bilateral periorbital edema, mild.  Associated, perhaps related, dependent edema of the midface area. PERRL.  EOMI.  No pain with extraocular movements.  No proptosis. Nose: No congestion. No rhinorrhea. Nasal congestion. Sinus tender. Mouth/Throat: Wearing mask.  No voice changes. Neck: Question submandibular lymphadenopathy.  No stridor. Cardiovascular: Normal rate, regular rhythm. Extremities well perfused. Respiratory: Normal respiratory effort.  Lungs CTAB. Gastrointestinal: Soft. Non-tender. Non-distended.  Musculoskeletal: No lower extremity edema. No deformities. Neurologic:  Normal speech and language. No gross focal neurologic deficits are appreciated. Full EOMI. CN II-XII in tact.  Skin: See above for findings on face. Psychiatric: Mood and affect are appropriate for situation.    Radiology  CT max/face: IMPRESSION: No subcutaneous or deep soft tissue abnormality of the face. Normal Orbits.  CT neck: IMPRESSION:  No acute abnormality of the neck.   MR: IMPRESSION:  Normal intracranial MRV   Procedures  Procedure(s) performed (including critical care):  Procedures   Initial Impression / Assessment and Plan / ED Course  34 y.o. female who presents to the ED for forehead cellulitis, facial swelling, as above.  Ddx: focal area of facial cellulitis + reactive lymphadenopathy and edema, sinusitis, orbital cellulitis, allergic  reaction, given COVID positive state +/- infection could also consider cavernous sinus thrombosis, though this is quite rare and she has no CN deficits which would make this less likely.   CT imaging is unrevealing. Will obtain MR to evaluate for any deeper space infection and r/o cavernous sinus thrombosis, and give IV medications in case of allergic reaction.  MR negative. Patient feeling mildly improved after medication. Will switch her regimen to doxycyline (to cover for possible MRSA + sinusitis). Cetrizine for congestion.  No new lotions or creams while healing. PCP follow up and return precautions. Patient comfortable w/ plan.    Final Clinical Impression(s) / ED Diagnosis  Final diagnoses:  Facial swelling  Cellulitis of forehead       Note:  This document was prepared using Dragon voice recognition software and may include unintentional dictation errors.   Miguel Aschoff., MD 11/15/19 2200

## 2019-11-15 NOTE — Discharge Instructions (Addendum)
Thank you for letting us take care of you in the emergency department today.   New medications we have prescribed:  - Doxycycline - take this instead of the Keflex medication - Cetirizine - to help with sinus drainage  Stop the topical ointment. No new lotions or creams while the area of your skin is healing.   Please follow up with: - Your primary care doctor to review your ER visit and follow up on your symptoms.   Please return to the ER for any new or worsening symptoms.

## 2019-11-15 NOTE — ED Notes (Signed)
Pt unable to sign for d/c instructions because signature pad does not work again.

## 2019-11-15 NOTE — ED Notes (Signed)
See triage note  Presents with wound to forehead  States area started as a scab  Now has swelling to forehead and face  No diff swallowing

## 2019-11-15 NOTE — ED Triage Notes (Signed)
Pt in via POV, scabbed area noted to forehead, states area started as a "dry patch" developing into wound.  Pt on PO antibiotic therapy x 24 hours without any relief.  Facial swelling noted around area.  NAD noted at this time.

## 2021-10-10 IMAGING — MR MR MRV HEAD WO/W CM
2 of 4 series · 46 of 48 positions shown · IV contrast (10 ml gadavist)
Comparison: None.

CLINICAL DATA: Facial swelling.

EXAM:
MR VENOGRAM HEAD WITHOUT AND WITH CONTRAST
TECHNIQUE: Angiographic images of the intracranial venous structures were
obtained using MRV technique without and with intravenous contrast.

[Series 5: tof_2d_paracor · coronal · 2.5mm · 0.98mm/px · 18 of 128 slices shown]
[im 1/128]
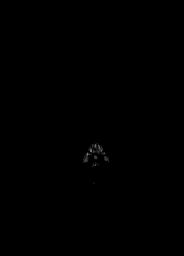
[im 8/128]
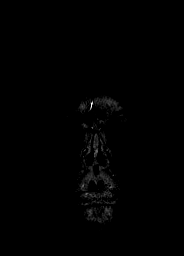
[im 15/128]
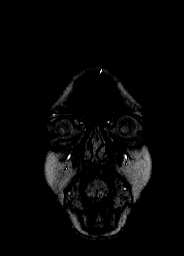
[im 23/128]
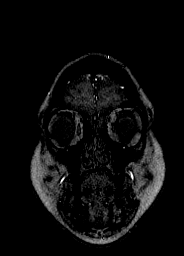
[im 30/128]
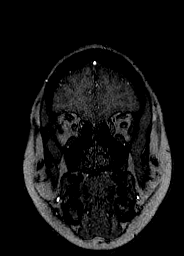
[im 38/128]
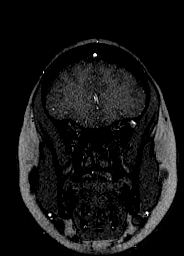
[im 45/128]
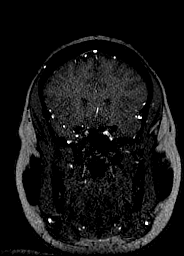
[im 53/128]
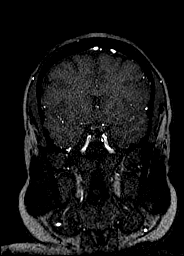
[im 60/128]
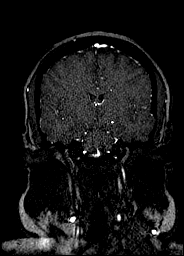
[im 68/128]
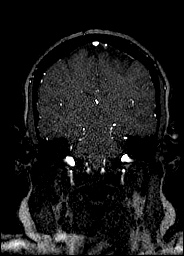
[im 75/128]
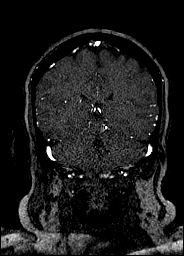
[im 83/128]
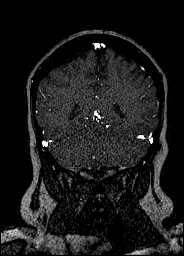
[im 90/128]
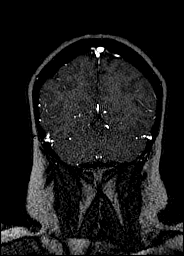
[im 98/128]
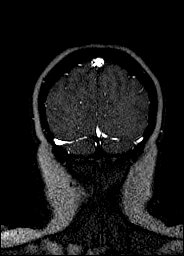
[im 105/128]
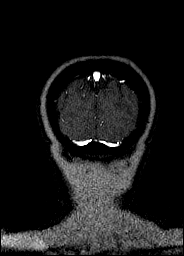
[im 113/128]
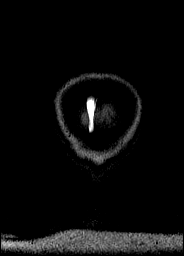
[im 120/128]
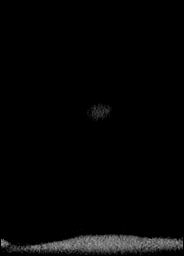
[im 128/128]
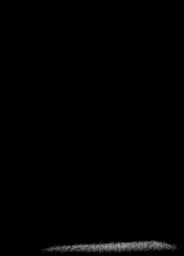

[Series 9: t1_mprage_sag_p2_iso · sagittal · 1.0mm · 0.98mm/px · 28 of 192 slices shown]
[im 1/192]
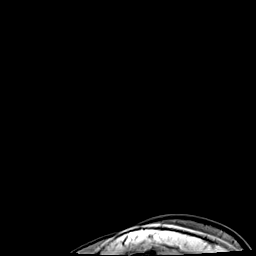
[im 8/192]
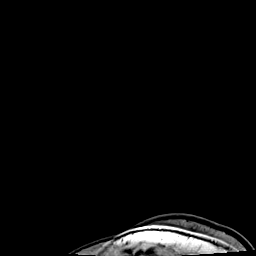
[im 15/192]
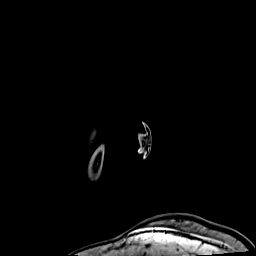
[im 22/192]
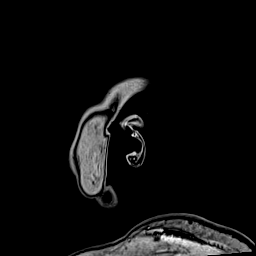
[im 29/192]
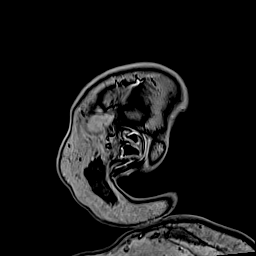
[im 36/192]
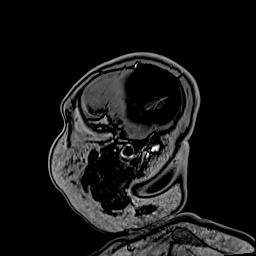
[im 43/192]
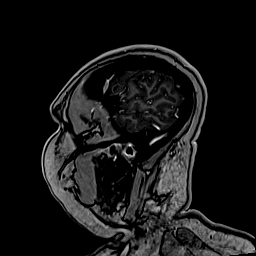
[im 50/192]
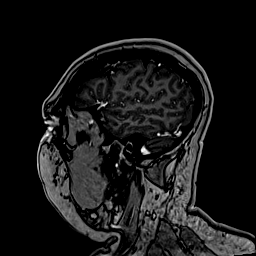
[im 57/192]
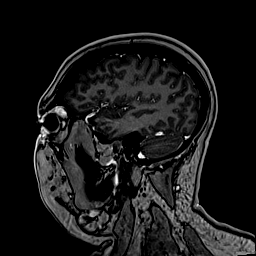
[im 64/192]
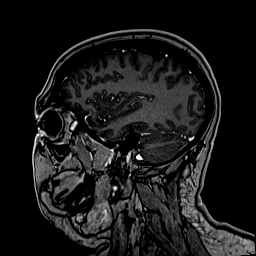
[im 71/192]
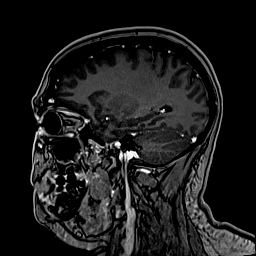
[im 78/192]
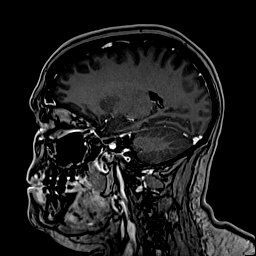
[im 85/192]
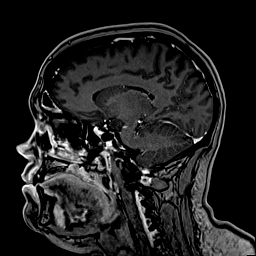
[im 92/192]
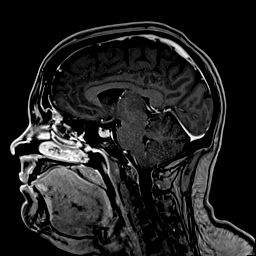
[im 100/192]
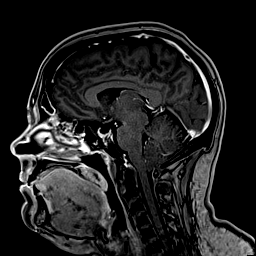
[im 107/192]
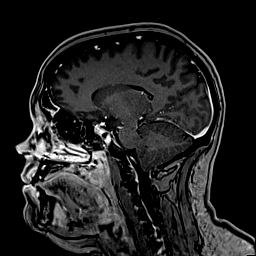
[im 114/192]
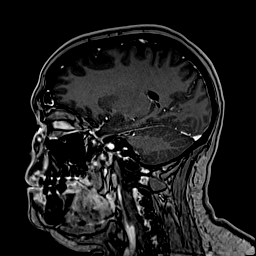
[im 121/192]
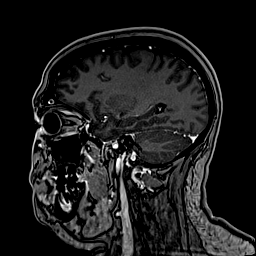
[im 128/192]
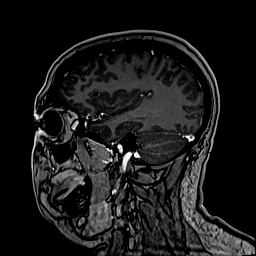
[im 135/192]
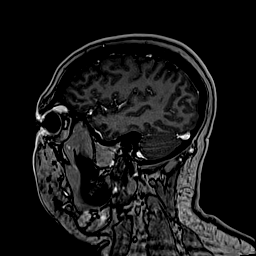
[im 142/192]
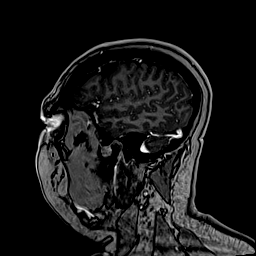
[im 149/192]
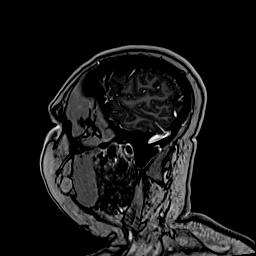
[im 156/192]
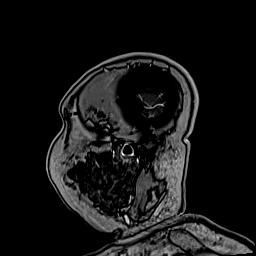
[im 163/192]
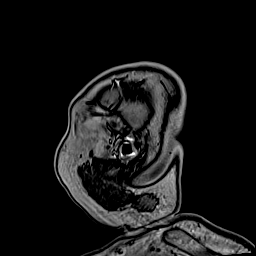
[im 170/192]
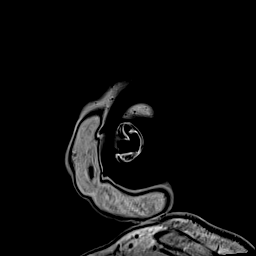
[im 177/192]
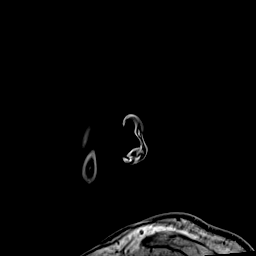
[im 184/192]
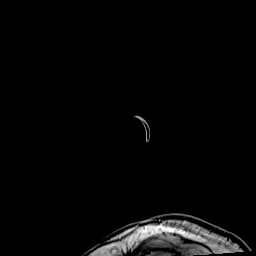
[im 192/192]
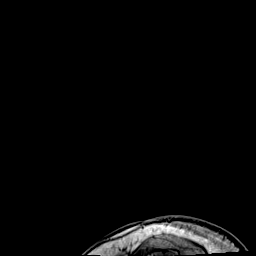

[46 of 48 positions shown; findings below may reference images not displayed]

FINDINGS: Superior sagittal sinus: Normal.

Straight sinus: Normal.

Inferior sagittal sinus, vein of Maly and internal cerebral veins:
Normal.

Transverse sinuses: Normal.

Sigmoid sinuses: Normal.

Visualized jugular veins: Normal.

Cavernous sinuses: Normal.
IMPRESSION: Normal intracranial MRV.

## 2022-07-14 ENCOUNTER — Encounter: Payer: Self-pay | Admitting: Family

## 2022-07-14 ENCOUNTER — Ambulatory Visit: Payer: BC Managed Care – PPO | Admitting: Family

## 2022-07-14 VITALS — BP 142/105 | HR 100 | Temp 98.6°F | Ht 62.0 in | Wt 292.0 lb

## 2022-07-14 DIAGNOSIS — Z6841 Body Mass Index (BMI) 40.0 and over, adult: Secondary | ICD-10-CM

## 2022-07-14 DIAGNOSIS — N912 Amenorrhea, unspecified: Secondary | ICD-10-CM | POA: Diagnosis not present

## 2022-07-14 DIAGNOSIS — R519 Headache, unspecified: Secondary | ICD-10-CM

## 2022-07-14 DIAGNOSIS — E039 Hypothyroidism, unspecified: Secondary | ICD-10-CM

## 2022-07-14 DIAGNOSIS — D509 Iron deficiency anemia, unspecified: Secondary | ICD-10-CM | POA: Diagnosis not present

## 2022-07-14 DIAGNOSIS — R631 Polydipsia: Secondary | ICD-10-CM | POA: Insufficient documentation

## 2022-07-14 DIAGNOSIS — I10 Essential (primary) hypertension: Secondary | ICD-10-CM

## 2022-07-14 DIAGNOSIS — K219 Gastro-esophageal reflux disease without esophagitis: Secondary | ICD-10-CM

## 2022-07-14 DIAGNOSIS — E782 Mixed hyperlipidemia: Secondary | ICD-10-CM

## 2022-07-14 MED ORDER — VALSARTAN-HYDROCHLOROTHIAZIDE 320-25 MG PO TABS: 1.0000 | ORAL_TABLET | Freq: Every day | ORAL | 3 refills | Status: DC

## 2022-07-14 NOTE — Progress Notes (Signed)
New Patient Office Visit  Subjective:  Patient ID: Desiree Santos, female    DOB: August 11, 1986  Age: 36 y.o. MRN: [id number]  CC:  Chief Complaint  Patient presents with   Establish Care   Hypertension    Very high at home feels like she is selling     HPI Desiree Santos is here to establish care as a new patient.  Prior provider was: Carren Rang, [facility]  Pt is without acute concerns.   2014 negative.  Trying to focus on grilled chicken, increase vegetables.  chronic concerns:  HTN: elevated blood pressure, she can feel it. She has nurse at school check her blood pressure. She doesn't check her blood pressure at home. She does have blood pressure cuff at home. She takes her blood pressure . She does notice swelling in bil lower legs, and also feels swollen in bil hands.   Feels very thirty.  Does feel hungry often.  Not peeing very often.   Menses: monthly. Can cause heavy periods, has not had a period in four months.   Hypothyroid: has gained a good amount of weight in the last two years. Does try to exercise  Wt Readings from Last 3 Encounters:  07/14/22 292 lb (132.5 kg)  11/15/19 218 lb (98.9 kg)  02/05/17 223 lb 12 oz (101.5 kg)    Lab Results  Component Value Date   TSH 1.46 07/14/2022     Past Medical History:  Diagnosis Date   Chicken pox    Elevated BP without diagnosis of hypertension    GERD (gastroesophageal reflux disease)    Headache    History of kidney stones    History of UTI    Thyroid disease     Past Surgical History:  Procedure Laterality Date   CHOLECYSTECTOMY     KIDNEY SURGERY  2002   h/o kidney reflux?   TONSILLECTOMY      Family History  Problem Relation Age of Onset   Hyperlipidemia Mother    Hypertension Mother    Heart disease Father    Sudden Cardiac Death Father        age 33   Arthritis Maternal Grandmother    Cancer Maternal Grandmother    Depression Maternal Grandmother    Hypertension Maternal  Grandmother    Dementia Maternal Grandmother    Hypertension Maternal Grandfather    Early death Paternal Grandmother    Cancer Paternal Grandmother    Breast cancer Paternal Grandmother        thinks in her 66's   Cancer Paternal Grandfather    Hypertension Paternal Grandfather    Hyperlipidemia Paternal Grandfather    Heart disease Paternal Grandfather    Early death Paternal Grandfather    Depression Paternal Grandfather     Social History   Socioeconomic History   Marital status: Married    Spouse name: Not on file   Number of children: 1   Years of education: HS   Highest education level: Not on file  Occupational History   Occupation: [employer]    Comment: Manufacturing engineer: [employer]    Comment: Geologist, engineering  Tobacco Use   Smoking status: Former    Years: 2.00    Types: Cigarettes   Smokeless tobacco: Never  Building services engineer Use: Never used  Substance and Sexual Activity   Alcohol use: No   Drug use: No   Sexual activity: Yes  Partners: Male    Birth control/protection: Abstinence    Comment: doesn't like to have sex  Other Topics Concern   Not on file  Social History Narrative   36 y/o boy    Married    Social Determinants of Corporate investment banker Strain: Not on file  Food Insecurity: Not on file  Transportation Needs: Not on file  Physical Activity: Not on file  Stress: Not on file  Social Connections: Not on file  Intimate Partner Violence: Not on file    Outpatient Medications Prior to Visit  Medication Sig Dispense Refill   FLUoxetine (PROZAC) 20 MG capsule Take 20 mg by mouth daily.     levothyroxine (SYNTHROID) 175 MCG tablet      meloxicam (MOBIC) 15 MG tablet Take 15 mg by mouth daily.     meloxicam (MOBIC) 15 MG tablet Take 1 tablet every day by oral route with meals.     olmesartan-hydrochlorothiazide (BENICAR HCT) 40-12.5 MG tablet Take 1 tablet by mouth daily.     cetirizine (ZYRTEC ALLERGY)  10 MG tablet Take 1 tablet (10 mg total) by mouth daily for 14 days. 14 tablet 0   levothyroxine (SYNTHROID, LEVOTHROID) 100 MCG tablet Take 1 tablet (100 mcg total) by mouth daily. (Patient taking differently: Take 175 mcg by mouth daily.) 60 tablet 0   pantoprazole (PROTONIX) 40 MG tablet Take 1 tablet (40 mg total) by mouth daily. 90 tablet 1   No facility-administered medications prior to visit.    No Known Allergies      Objective:    Physical Exam  Gen: NAD, resting comfortably CV: RRR with no murmurs appreciated Pulm: NWOB, CTAB with no crackles, wheezes, or rhonchi Skin: warm, dry Psych: Normal affect and thought content  BP (!) 142/105   Pulse 100   Temp 98.6 F (37 C) (Oral)   Ht 5\' 2"  (1.575 m)   Wt 292 lb (132.5 kg)   SpO2 97%   BMI 53.41 kg/m  Wt Readings from Last 3 Encounters:  07/14/22 292 lb (132.5 kg)  11/15/19 218 lb (98.9 kg)  02/05/17 223 lb 12 oz (101.5 kg)     Health Maintenance Due  Topic Date Due   HIV Screening  Never done   Hepatitis C Screening  Never done   TETANUS/TDAP  Never done   PAP SMEAR-Modifier  09/11/2019    There are no preventive care reminders to display for this patient.  Lab Results  Component Value Date   TSH 1.46 07/14/2022   Lab Results  Component Value Date   WBC 8.7 07/14/2022   HGB 13.2 07/14/2022   HCT 39.6 07/14/2022   MCV 87.4 07/14/2022   PLT 279.0 07/14/2022   Lab Results  Component Value Date   NA 138 11/15/2019   K 4.0 11/15/2019   CO2 24 11/15/2019   GLUCOSE 89 11/15/2019   BUN 13 11/15/2019   CREATININE 0.70 11/15/2019   BILITOT 0.4 11/05/2016   ALKPHOS 61 11/05/2016   AST 36 11/05/2016   ALT 33 11/05/2016   PROT 7.3 11/05/2016   ALBUMIN 4.5 11/05/2016   CALCIUM 9.4 11/15/2019   ANIONGAP 11 11/15/2019   GFR 137.02 11/05/2016   Lab Results  Component Value Date   CHOL 219 (H) 11/05/2016   Lab Results  Component Value Date   HDL 45.40 11/05/2016   Lab Results  Component Value  Date   LDLCALC 103 08/12/2016   Lab Results  Component Value Date  TRIG 315.0 (H) 11/05/2016   Lab Results  Component Value Date   CHOLHDL 5 11/05/2016   Lab Results  Component Value Date   HGBA1C 5.6 07/14/2022      Assessment & Plan:   Problem List Items Addressed This Visit       Cardiovascular and Mediastinum   Primary hypertension    Stop Benicar Start valsartan hydrochlorothiazide -320-25 Pt advised of the following:  Continue medication as prescribed. Monitor blood pressure periodically and/or when you feel symptomatic. Goal is <130/90 on average. Ensure that you have rested for 30 minutes prior to checking your blood pressure. Record your readings and bring them to your next visit if necessary.work on a low sodium diet.       Relevant Medications   valsartan-hydrochlorothiazide (DIOVAN-HCT) 320-25 MG tablet     Digestive   Gastroesophageal reflux disease without esophagitis    Try to decrease and or avoid spicy foods, fried fatty foods, and also caffeine and chocolate as these can increase heartburn symptoms.          Endocrine   Hypothyroidism    Ordering TSH pending results Continue levothyroxine 175 mcg once daily Vies patient to make sure she is taking separately for meds and other food at least by 30 minutes to 1 hour      Relevant Medications   levothyroxine (SYNTHROID) 175 MCG tablet   Other Relevant Orders   TSH (Completed)     Other   HLD (hyperlipidemia)    Ordered lipid panel, pending results. Work on low cholesterol diet and exercise as tolerated       Relevant Medications   valsartan-hydrochlorothiazide (DIOVAN-HCT) 320-25 MG tablet   Morbid obesity with BMI of 40.0-44.9, adult ([place])    Educated on diet and exercise      Nonintractable headache    Ordering TSH pending results Suspect that this is likely due to elevated blood pressure starting new blood pressure medication today to see if this helps      Relevant Medications    meloxicam (MOBIC) 15 MG tablet   FLUoxetine (PROZAC) 20 MG capsule   Iron deficiency anemia    Order CBC pending results      Relevant Orders   CBC (Completed)   Vitamin B12 (Completed)   Polydipsia - Primary    Ordering A1c pending results      Relevant Orders   Hemoglobin A1c (Completed)   Amenorrhea    Ordering TSH pending results      Relevant Orders   POCT urine pregnancy    Meds ordered this encounter  Medications   valsartan-hydrochlorothiazide (DIOVAN-HCT) 320-25 MG tablet    Sig: Take 1 tablet by mouth daily.    Dispense:  90 tablet    Refill:  3    Order Specific Question:   Supervising Provider    Answer:   Diona Browner, AMY E [[id number]]    Follow-up: Return in about 2 weeks (around 07/28/2022) for f/u blood pressure.    Eugenia Pancoast, FNP

## 2022-07-14 NOTE — Patient Instructions (Addendum)
Start over the counter fish oils (omega three)  Stop olmesartan,  Start valsartan HCTZ 320/25  F/u two weeks with blood pressure log in office.   Welcome to our clinic, I am happy to have you as my new patient. I am excited to continue on this healthcare journey with you.  Stop by the lab prior to leaving today. I will notify you of your results once received.   Please keep in mind Any my chart messages you send have up to a three business day turnaround for a response.  Phone calls may take up to a one full business day turnaround for a  response.   If you need a medication refill I recommend you request it through the pharmacy as this is easiest for [place] rather than sending a message and or phone call.   Due to recent changes in healthcare laws, you may see results of your imaging and/or laboratory studies on MyChart before I have had a chance to review them.  I understand that in some cases there may be results that are confusing or concerning to you. Please understand that not all results are received at the same time and often I may need to interpret multiple results in order to provide you with the best plan of care or course of treatment. Therefore, I ask that you please give me 2 business days to thoroughly review all your results before contacting my office for clarification. Should we see a critical lab result, you will be contacted sooner.   It was a pleasure seeing you today! Please do not hesitate to reach out with any questions and or concerns.  Regards,   Eugenia Pancoast FNP-C

## 2022-07-15 LAB — CBC
HCT: 39.6 % (ref 36.0–46.0)
Hemoglobin: 13.2 g/dL (ref 12.0–15.0)
MCHC: 33.3 g/dL (ref 30.0–36.0)
MCV: 87.4 fl (ref 78.0–100.0)
Platelets: 279 10*3/uL (ref 150.0–400.0)
RBC: 4.52 Mil/uL (ref 3.87–5.11)
RDW: 13 % (ref 11.5–15.5)
WBC: 8.7 10*3/uL (ref 4.0–10.5)

## 2022-07-15 LAB — TSH: TSH: 1.46 u[IU]/mL (ref 0.35–5.50)

## 2022-07-15 LAB — HEMOGLOBIN A1C: Hgb A1c MFr Bld: 5.6 % (ref 4.6–6.5)

## 2022-07-15 LAB — VITAMIN B12: Vitamin B-12: 300 pg/mL (ref 211–911)

## 2022-07-21 DIAGNOSIS — N912 Amenorrhea, unspecified: Secondary | ICD-10-CM | POA: Insufficient documentation

## 2022-07-21 DIAGNOSIS — I10 Essential (primary) hypertension: Secondary | ICD-10-CM | POA: Insufficient documentation

## 2022-07-21 NOTE — Assessment & Plan Note (Signed)
Ordering TSH pending results. ?

## 2022-07-21 NOTE — Assessment & Plan Note (Signed)
Ordering TSH pending results Continue levothyroxine 175 mcg once daily Vies patient to make sure she is taking separately for meds and other food at least by 30 minutes to 1 hour

## 2022-07-21 NOTE — Assessment & Plan Note (Signed)
Order CBC pending results 

## 2022-07-21 NOTE — Assessment & Plan Note (Signed)
Educated on diet and exercise

## 2022-07-21 NOTE — Assessment & Plan Note (Signed)
Stop Benicar Start valsartan hydrochlorothiazide -320-25 Pt advised of the following:  Continue medication as prescribed. Monitor blood pressure periodically and/or when you feel symptomatic. Goal is <130/90 on average. Ensure that you have rested for 30 minutes prior to checking your blood pressure. Record your readings and bring them to your next visit if necessary.work on a low sodium diet.

## 2022-07-21 NOTE — Assessment & Plan Note (Signed)
Ordering A1c pending results 

## 2022-07-21 NOTE — Assessment & Plan Note (Signed)
Ordered lipid panel, pending results. Work on low cholesterol diet and exercise as tolerated ? ?

## 2022-07-21 NOTE — Assessment & Plan Note (Signed)
Ordering TSH pending results Suspect that this is likely due to elevated blood pressure starting new blood pressure medication today to see if this helps

## 2022-07-21 NOTE — Assessment & Plan Note (Signed)
Try to decrease and or avoid spicy foods, fried fatty foods, and also caffeine and chocolate as these can increase heartburn symptoms.   

## 2022-07-28 ENCOUNTER — Encounter: Payer: Self-pay | Admitting: Family

## 2022-07-28 ENCOUNTER — Ambulatory Visit: Payer: BC Managed Care – PPO | Admitting: Family

## 2022-07-28 VITALS — BP 138/88 | HR 70 | Temp 98.3°F | Resp 16 | Ht 62.0 in | Wt 240.5 lb

## 2022-07-28 DIAGNOSIS — E039 Hypothyroidism, unspecified: Secondary | ICD-10-CM

## 2022-07-28 DIAGNOSIS — Z6841 Body Mass Index (BMI) 40.0 and over, adult: Secondary | ICD-10-CM

## 2022-07-28 DIAGNOSIS — M25522 Pain in left elbow: Secondary | ICD-10-CM

## 2022-07-28 DIAGNOSIS — R12 Heartburn: Secondary | ICD-10-CM

## 2022-07-28 MED ORDER — OMEPRAZOLE 20 MG PO CPDR: 20.0000 mg | DELAYED_RELEASE_CAPSULE | Freq: Every day | ORAL | 3 refills | Status: DC

## 2022-07-28 MED ORDER — MELOXICAM 15 MG PO TABS: 15.0000 mg | ORAL_TABLET | Freq: Every day | ORAL | 0 refills | Status: DC

## 2022-07-28 MED ORDER — LEVOTHYROXINE SODIUM 175 MCG PO TABS: 175.0000 ug | ORAL_TABLET | Freq: Every day | ORAL | 3 refills | Status: DC

## 2022-07-28 NOTE — Assessment & Plan Note (Signed)
Refilled levothyroxine 175 mcg once daily

## 2022-07-28 NOTE — Assessment & Plan Note (Signed)
Spoke with pt on ideas for weight loss goals, myfitness pal a good option to daily track calorie goals.  Also recommended increasing exercise.

## 2022-07-28 NOTE — Assessment & Plan Note (Signed)
Suspected tennis elbow Handout sent to mychart on proper care Advised to wear brace on tendon daily for two weeks at least Heat/ice Try to determine the source of overuse Refill mobic 15 mg however advised pt to watch blood pressure to see if this increases

## 2022-07-28 NOTE — Patient Instructions (Addendum)
  Regards,     FNP-C  

## 2022-07-28 NOTE — Progress Notes (Signed)
Established Patient Office Visit  Subjective:  Patient ID: Desiree Santos, female    DOB: 1986/06/19  Age: 36 y.o. MRN: [id number]  CC:  Chief Complaint  Patient presents with   Hypertension    HPI Desiree Santos is here today for follow up.   HTN: has been good with change of medication to diovan hct 320-25 mg. Tolerating medication well. Blood pressure average 125/85. Highest was 190/90 but this was one occurrence, lowest 117/57.   Vitamin B12 deficiency: not taking anything at current.   Obesity: at least three times a week going up and down her driveway at least thirty minutes at a time. She also is trying to work on choosing healthier food options, usually opting for grilled chicken. States doesn't often have fried fatty foods and or high amounts of caffeine nad or chocolate .   C/o left elbow pain, was on mobic 15 mg once daily however has since ran out and pain is back and throbbing. Pain with lifting items, feels some tingling down her left anterior forearm at times. Wears braces occasionally but not everyday.  Past Medical History:  Diagnosis Date   Chicken pox    Elevated BP without diagnosis of hypertension    GERD (gastroesophageal reflux disease)    Headache    History of kidney stones    History of UTI    Thyroid disease     Past Surgical History:  Procedure Laterality Date   CHOLECYSTECTOMY     KIDNEY SURGERY  2002   h/o kidney reflux?   TONSILLECTOMY      Family History  Problem Relation Age of Onset   Hyperlipidemia Mother    Hypertension Mother    Heart disease Father    Sudden Cardiac Death Father        age 64   Arthritis Maternal Grandmother    Cancer Maternal Grandmother    Depression Maternal Grandmother    Hypertension Maternal Grandmother    Dementia Maternal Grandmother    Hypertension Maternal Grandfather    Early death Paternal Grandmother    Cancer Paternal Grandmother    Breast cancer Paternal Grandmother        thinks in  her 32's   Cancer Paternal Grandfather    Hypertension Paternal Grandfather    Hyperlipidemia Paternal Grandfather    Heart disease Paternal Grandfather    Early death Paternal Grandfather    Depression Paternal Grandfather     Social History   Socioeconomic History   Marital status: Married    Spouse name: Not on file   Number of children: 1   Years of education: HS   Highest education level: Not on file  Occupational History   Occupation: Quarry manager    Comment: Airline pilot: [employer]    Comment: Control and instrumentation engineer  Tobacco Use   Smoking status: Former    Years: 2.00    Types: Cigarettes   Smokeless tobacco: Never  Scientific laboratory technician Use: Never used  Substance and Sexual Activity   Alcohol use: No   Drug use: No   Sexual activity: Yes    Partners: Male    Birth control/protection: Abstinence    Comment: doesn't like to have sex  Other Topics Concern   Not on file  Social History Narrative   36 y/o boy    Married    Social Determinants of Radio broadcast assistant Strain: Not on file  Food Insecurity:  Not on file  Transportation Needs: Not on file  Physical Activity: Not on file  Stress: Not on file  Social Connections: Not on file  Intimate Partner Violence: Not on file    Outpatient Medications Prior to Visit  Medication Sig Dispense Refill   FLUoxetine (PROZAC) 20 MG capsule Take 20 mg by mouth daily.     valsartan-hydrochlorothiazide (DIOVAN-HCT) 320-25 MG tablet Take 1 tablet by mouth daily. 90 tablet 3   levothyroxine (SYNTHROID) 175 MCG tablet      meloxicam (MOBIC) 15 MG tablet Take 15 mg by mouth daily.     No facility-administered medications prior to visit.    No Known Allergies        Objective:    Physical Exam Constitutional:      General: She is not in acute distress.    Appearance: Normal appearance. She is obese. She is not ill-appearing, toxic-appearing or diaphoretic.  Cardiovascular:      Rate and Rhythm: Normal rate and regular rhythm.  Pulmonary:     Effort: Pulmonary effort is normal.  Abdominal:     General: Abdomen is flat.     Palpations: Abdomen is soft.     Tenderness: There is no abdominal tenderness. There is no guarding or rebound.  Neurological:     General: No focal deficit present.     Mental Status: She is alert and oriented to person, place, and time. Mental status is at baseline.  Psychiatric:        Mood and Affect: Mood normal.        Behavior: Behavior normal.        Thought Content: Thought content normal.        Judgment: Judgment normal.       BP 138/88   Pulse 70   Temp 98.3 F (36.8 C)   Resp 16   Ht 5\' 2"  (1.575 m)   Wt 240 lb 8 oz (109.1 kg)   LMP  (LMP Unknown)   SpO2 97%   BMI 43.99 kg/m  Wt Readings from Last 3 Encounters:  07/28/22 240 lb 8 oz (109.1 kg)  07/14/22 292 lb (132.5 kg)  11/15/19 218 lb (98.9 kg)     Health Maintenance Due  Topic Date Due   HIV Screening  Never done   Hepatitis C Screening  Never done   TETANUS/TDAP  Never done   PAP SMEAR-Modifier  09/11/2019    There are no preventive care reminders to display for this patient.  Lab Results  Component Value Date   TSH 1.46 07/14/2022   Lab Results  Component Value Date   WBC 8.7 07/14/2022   HGB 13.2 07/14/2022   HCT 39.6 07/14/2022   MCV 87.4 07/14/2022   PLT 279.0 07/14/2022   Lab Results  Component Value Date   NA 138 11/15/2019   K 4.0 11/15/2019   CO2 24 11/15/2019   GLUCOSE 89 11/15/2019   BUN 13 11/15/2019   CREATININE 0.70 11/15/2019   BILITOT 0.4 11/05/2016   ALKPHOS 61 11/05/2016   AST 36 11/05/2016   ALT 33 11/05/2016   PROT 7.3 11/05/2016   ALBUMIN 4.5 11/05/2016   CALCIUM 9.4 11/15/2019   ANIONGAP 11 11/15/2019   GFR 137.02 11/05/2016   Lab Results  Component Value Date   CHOL 219 (H) 11/05/2016   Lab Results  Component Value Date   HDL 45.40 11/05/2016   Lab Results  Component Value Date   LDLCALC 103  08/12/2016  Lab Results  Component Value Date   TRIG 315.0 (H) 11/05/2016   Lab Results  Component Value Date   CHOLHDL 5 11/05/2016   Lab Results  Component Value Date   HGBA1C 5.6 07/14/2022      Assessment & Plan:   Problem List Items Addressed This Visit       Endocrine   Hypothyroidism    Refilled levothyroxine 175 mcg once daily       Relevant Medications   levothyroxine (SYNTHROID) 175 MCG tablet     Other   Morbid obesity with BMI of 40.0-44.9, adult ([place])    Spoke with pt on ideas for weight loss goals, myfitness pal a good option to daily track calorie goals.  Also recommended increasing exercise.       Relevant Medications   levothyroxine (SYNTHROID) 175 MCG tablet   Left elbow pain    Suspected tennis elbow Handout sent to mychart on proper care Advised to wear brace on tendon daily for two weeks at least Heat/ice Try to determine the source of overuse Refill mobic 15 mg however advised pt to watch blood pressure to see if this increases       Relevant Medications   meloxicam (MOBIC) 15 MG tablet   Heartburn - Primary   Relevant Medications   omeprazole (PRILOSEC) 20 MG capsule    Meds ordered this encounter  Medications   omeprazole (PRILOSEC) 20 MG capsule    Sig: Take 1 capsule (20 mg total) by mouth daily.    Dispense:  30 capsule    Refill:  3    Order Specific Question:   Supervising Provider    Answer:   BEDSOLE, AMY E [[id number]]   levothyroxine (SYNTHROID) 175 MCG tablet    Sig: Take 1 tablet (175 mcg total) by mouth daily before breakfast.    Dispense:  90 tablet    Refill:  3    Order Specific Question:   Supervising Provider    Answer:   BEDSOLE, AMY E [[id number]]   meloxicam (MOBIC) 15 MG tablet    Sig: Take 1 tablet (15 mg total) by mouth daily.    Dispense:  90 tablet    Refill:  0    Order Specific Question:   Supervising Provider    Answer:   Diona Browner, AMY E [id number]    Follow-up: Return in about 3 months (around 10/28/2022)  for f/u blood pressure.    Eugenia Pancoast, FNP

## 2022-07-30 NOTE — Progress Notes (Signed)
[facility] did not view their my chart test result that I responded to one week ago.  Please call them and advise them of the below results.

## 2022-09-25 DIAGNOSIS — O0992 Supervision of high risk pregnancy, unspecified, second trimester: Secondary | ICD-10-CM | POA: Insufficient documentation

## 2022-09-25 DIAGNOSIS — O0993 Supervision of high risk pregnancy, unspecified, third trimester: Secondary | ICD-10-CM | POA: Insufficient documentation

## 2022-10-20 HISTORY — PX: WISDOM TOOTH EXTRACTION: SHX21

## 2022-10-27 NOTE — L&D Delivery Note (Signed)
Delivery Note  Desiree Santos is a G2P1001 at [redacted]w[redacted]d dated by an early [place] at [redacted]w[redacted]d.   First Stage: Labor onset: 1148 Induction: misoprostol, oxytocin, AROM, and cervical balloon Analgesia /Anesthesia intrapartum: none AROM at 1502 GBS: negative IP Antibiotics: none  Second Stage: Complete dilation at 1938 Onset of pushing at 1941 FHR second stage 130 bpm with moderate variability, moderate variable decels with pushing   Desiree Santos presented to L&D with CHTN. She was 2.5/60/-3. She progressed  to C/C/+2 with a spontaneous urge to push.  She pushed  effectively over approximately 4 minutes for a spontaneous vaginal birth.  Delivery of a viable baby boy on 04/28/23 at 1945 by CNM Delivery of fetal head in OA position with compound hand with restitution to LOT. no nuchal cord;  Anterior then posterior shoulders delivered easily with gentle downward traction. Baby placed on mom's chest, and attended to by baby RN Cord double clamped after cessation of pulsation, cut by FOB  Cord blood sample collection: Yes O POS Performed at [facility], [address] [place], [place] [address]  Collection of cord blood donation NO Arterial cord blood sample NO  Third Stage: Oxytocin bolus started after delivery of placenta. for hemorrhage prophylaxis, followed by of rectal cytotec and TXA Placenta delivered schultz intact with 3 VC @ 1952. Placenta examined and appears to be intact. Uterus manually explored d/t bleeding and small amount of trailing membranes identified with large clots. Placenta disposition: discarded Uterine tone firm / bleeding moderate  1st degree labial laceration identified  Anesthesia for repair: lidocaine Repair 3-0 vicryl Est. Blood Loss (mL): 600  Complications: none  Mom to postpartum.  Baby to Couplet care / Skin to Skin.  Newborn: Information for the patient's newborn:  Jaela, Coller [[id number]]  Live born female "[place]" Birth Weight:    APGAR: ,   Newborn Delivery   Birth date/time: 04/28/2023 19:45:00 Delivery type: Vaginal, Spontaneous       Feeding planned: formula feeding  ---------- Chari Manning, CNM Certified Nurse Midwife [facility]

## 2022-10-28 ENCOUNTER — Ambulatory Visit: Payer: BC Managed Care – PPO | Admitting: Family

## 2022-10-28 ENCOUNTER — Encounter: Payer: Self-pay | Admitting: Family

## 2022-10-28 ENCOUNTER — Other Ambulatory Visit: Payer: Self-pay | Admitting: Family

## 2022-10-28 VITALS — BP 120/78 | HR 103 | Temp 97.9°F | Resp 16 | Ht 62.0 in | Wt 240.4 lb

## 2022-10-28 DIAGNOSIS — Z3A11 11 weeks gestation of pregnancy: Secondary | ICD-10-CM | POA: Diagnosis not present

## 2022-10-28 DIAGNOSIS — I10 Essential (primary) hypertension: Secondary | ICD-10-CM

## 2022-10-28 DIAGNOSIS — R12 Heartburn: Secondary | ICD-10-CM

## 2022-10-28 DIAGNOSIS — E039 Hypothyroidism, unspecified: Secondary | ICD-10-CM

## 2022-10-28 LAB — TSH: TSH: 26.9 u[IU]/mL — ABNORMAL HIGH (ref 0.35–5.50)

## 2022-10-28 MED ORDER — LEVOTHYROXINE SODIUM 88 MCG PO TABS: ORAL_TABLET | ORAL | 1 refills | Status: DC

## 2022-10-28 MED ORDER — OMEPRAZOLE 20 MG PO CPDR: 20.0000 mg | DELAYED_RELEASE_CAPSULE | Freq: Every day | ORAL | 3 refills | Status: DC

## 2022-10-28 MED ORDER — LEVOTHYROXINE SODIUM 100 MCG PO TABS: ORAL_TABLET | ORAL | 1 refills | Status: DC

## 2022-10-28 NOTE — Assessment & Plan Note (Signed)
Stable without medication  

## 2022-10-28 NOTE — Progress Notes (Signed)
Hi Ms. Desiree Santos,   I am Desiree Santos's PCP. It looks like you are her ob? I tested her TSH today for her hypothyroid and it is elevated. I wanted to check in and see if maybe you will be taking this over and or if you want me to continue monitoring (or if she maybe needs a higher risk endo for her pregnancy). She does have her TSH fluctuate since I have started seeing her. I didn't want to double dip. She is currently at 175 mcg once daily, I am going to increase her to 188 mcg once daily. Let me know your thoughts.

## 2022-10-28 NOTE — Progress Notes (Signed)
Please confirm pt on 175 mcg once daily of levothyroxine. She needs to make sure she is taking this daily as best she can because her tsh has increased and she needs this for brain health for baby. Increase to 188 mcg once daily (this will be 100 mcg once daily as well as 88 mcg once daily pill - take together for total 188 mcg). Advise pt I have also sent a message to her OB to see if they want to monitor this moving forward. If they do not pt will need lab only appt in four weeks to repeat tsh.

## 2022-10-28 NOTE — Patient Instructions (Signed)
  Ask GYN in regards to treating thyroid while pregnant, if she is not managing please f/u with me in three months if she is f/u with me post pregnancy.    Regards,   Eugenia Pancoast FNP-C

## 2022-10-28 NOTE — Assessment & Plan Note (Signed)
Check Tsh today continue levothyroxine 175 mcg once daily.  Pending results of TSH

## 2022-10-28 NOTE — Assessment & Plan Note (Signed)
Continue f/u with GYN  Inquire if they will be treating/checking TSH and if they are f/u with me post pregnancy otherwise advised to f/u with in three months.

## 2022-10-28 NOTE — Progress Notes (Signed)
Established Patient Office Visit  Subjective:  Patient ID: Desiree Santos, female    DOB: August 25, 1986  Age: 37 y.o. MRN: [id number]  CC:  Chief Complaint  Patient presents with   Hypertension    HPI Desiree Santos is here today for follow up.   Hypertension: not currently taking labetolol 200 mg bid states unable to take in the am, as she is nauseous 24/7  Anti-nausea: taking promethazine 25 mg as needed for nausea pretty much taking daily. Trying her best to take Flintstone gummies.  111/28 6 w 4 days EDD 7/21.    Pregnancy: currently seen by Vern Claude, CNM. F/u next Wednesday with her.   Hypothyroid: pt states was without levothyroxine for a few weeks due to severe nausea. Has started taking at night and able to tolerate a little bit better, typically 5/7 days in the few days.  Lab Results  Component Value Date   TSH 1.46 07/14/2022   Depression with anxiety: stable per pt. D/c prozac when she found out she was pregnant    Past Medical History:  Diagnosis Date   Chicken pox    Elevated BP without diagnosis of hypertension    GERD (gastroesophageal reflux disease)    Headache    History of kidney stones    History of UTI    Thyroid disease     Past Surgical History:  Procedure Laterality Date   CHOLECYSTECTOMY     KIDNEY SURGERY  2002   h/o kidney reflux?   TONSILLECTOMY      Family History  Problem Relation Age of Onset   Hyperlipidemia Mother    Hypertension Mother    Heart disease Father    Sudden Cardiac Death Father        age 83   Arthritis Maternal Grandmother    Cancer Maternal Grandmother    Depression Maternal Grandmother    Hypertension Maternal Grandmother    Dementia Maternal Grandmother    Hypertension Maternal Grandfather    Early death Paternal Grandmother    Cancer Paternal Grandmother    Breast cancer Paternal Grandmother        thinks in her 13's   Cancer Paternal Grandfather    Hypertension Paternal Grandfather     Hyperlipidemia Paternal Grandfather    Heart disease Paternal Grandfather    Early death Paternal Grandfather    Depression Paternal Grandfather     Social History   Socioeconomic History   Marital status: Significant Other    Spouse name: Not on file   Number of children: 1   Years of education: HS   Highest education level: Not on file  Occupational History   Occupation: Quarry manager    Comment: Airline pilot: [employer]    Comment: Control and instrumentation engineer  Tobacco Use   Smoking status: Former    Years: 2.00    Types: Cigarettes   Smokeless tobacco: Never  Scientific laboratory technician Use: Never used  Substance and Sexual Activity   Alcohol use: No   Drug use: No   Sexual activity: Yes    Partners: Male    Birth control/protection: Abstinence    Comment: doesn't like to have sex  Other Topics Concern   Not on file  Social History Narrative   37 y/o boy    Married    Social Determinants of Radio broadcast assistant Strain: Not on file  Food Insecurity: Not on file  Transportation Needs:  Not on file  Physical Activity: Not on file  Stress: Not on file  Social Connections: Not on file  Intimate Partner Violence: Not on file    Outpatient Medications Prior to Visit  Medication Sig Dispense Refill   Pediatric Multivit-Minerals (FLINTSTONES COMPLETE PO) Take 1 tablet by mouth daily.     levothyroxine (SYNTHROID) 175 MCG tablet Take 1 tablet (175 mcg total) by mouth daily before breakfast. (Patient not taking: Reported on 10/28/2022) 90 tablet 3   FLUoxetine (PROZAC) 20 MG capsule Take 20 mg by mouth daily. (Patient not taking: Reported on 10/28/2022)     meloxicam (MOBIC) 15 MG tablet Take 1 tablet (15 mg total) by mouth daily. (Patient not taking: Reported on 10/28/2022) 90 tablet 0   omeprazole (PRILOSEC) 20 MG capsule Take 1 capsule (20 mg total) by mouth daily. (Patient not taking: Reported on 10/28/2022) 30 capsule 3   valsartan-hydrochlorothiazide  (DIOVAN-HCT) 320-25 MG tablet Take 1 tablet by mouth daily. (Patient not taking: Reported on 10/28/2022) 90 tablet 3   No facility-administered medications prior to visit.    No Known Allergies      Objective:    Physical Exam Constitutional:      Appearance: Normal appearance. She is obese.  Cardiovascular:     Rate and Rhythm: Normal rate and regular rhythm.  Pulmonary:     Effort: Pulmonary effort is normal.  Neurological:     General: No focal deficit present.     Mental Status: She is alert and oriented to person, place, and time. Mental status is at baseline.  Psychiatric:        Mood and Affect: Mood normal.        Behavior: Behavior normal.        Thought Content: Thought content normal.        Judgment: Judgment normal.      BP 120/78   Pulse (!) 103   Temp 97.9 F (36.6 C)   Resp 16   Ht 5\' 2"  (1.575 m)   Wt 240 lb 6 oz (109 kg)   LMP  (LMP Unknown)   SpO2 99%   BMI 43.97 kg/m  Wt Readings from Last 3 Encounters:  10/28/22 240 lb 6 oz (109 kg)  07/28/22 240 lb 8 oz (109.1 kg)  07/14/22 292 lb (132.5 kg)     Health Maintenance Due  Topic Date Due   HIV Screening  Never done   Hepatitis C Screening  Never done   DTaP/Tdap/Td (1 - Tdap) Never done   PAP SMEAR-Modifier  09/11/2019    There are no preventive care reminders to display for this patient.  Lab Results  Component Value Date   TSH 1.46 07/14/2022   Lab Results  Component Value Date   WBC 8.7 07/14/2022   HGB 13.2 07/14/2022   HCT 39.6 07/14/2022   MCV 87.4 07/14/2022   PLT 279.0 07/14/2022   Lab Results  Component Value Date   NA 138 11/15/2019   K 4.0 11/15/2019   CO2 24 11/15/2019   GLUCOSE 89 11/15/2019   BUN 13 11/15/2019   CREATININE 0.70 11/15/2019   BILITOT 0.4 11/05/2016   ALKPHOS 61 11/05/2016   AST 36 11/05/2016   ALT 33 11/05/2016   PROT 7.3 11/05/2016   ALBUMIN 4.5 11/05/2016   CALCIUM 9.4 11/15/2019   ANIONGAP 11 11/15/2019   GFR 137.02 11/05/2016    Lab Results  Component Value Date   CHOL 219 (H) 11/05/2016   Lab Results  Component Value Date   HDL 45.40 11/05/2016   Lab Results  Component Value Date   LDLCALC 103 08/12/2016   Lab Results  Component Value Date   TRIG 315.0 (H) 11/05/2016   Lab Results  Component Value Date   CHOLHDL 5 11/05/2016   Lab Results  Component Value Date   HGBA1C 5.6 07/14/2022      Assessment & Plan:   Problem List Items Addressed This Visit       Cardiovascular and Mediastinum   Primary hypertension    Stable without medication.         Endocrine   Hypothyroidism - Primary   Relevant Orders   TSH     Other   [redacted] weeks gestation of pregnancy    Continue f/u with GYN  Inquire if they will be treating/checking TSH and if they are f/u with me post pregnancy otherwise advised to f/u with in three months.       Other Visit Diagnoses     Heartburn       Relevant Medications   omeprazole (PRILOSEC) 20 MG capsule       Meds ordered this encounter  Medications   omeprazole (PRILOSEC) 20 MG capsule    Sig: Take 1 capsule (20 mg total) by mouth daily.    Dispense:  30 capsule    Refill:  3    Order Specific Question:   Supervising Provider    Answer:   Diona Browner, AMY E [[id number]]    Follow-up: Return in about 8 months (around 06/29/2023) for f/u post pregnancy .    Eugenia Pancoast, FNP

## 2022-10-29 NOTE — Progress Notes (Signed)
Thank you for your prompt response. I appreciate it. I will have her increase to 188 mcg and make sure she can try to take daily. She stated her nausea has been so severe that she has been skipping the medication often throughout the week.

## 2022-10-31 ENCOUNTER — Other Ambulatory Visit: Payer: Self-pay

## 2022-11-04 NOTE — Progress Notes (Signed)
Can we send letter to this patient?

## 2022-11-05 LAB — HM PAP SMEAR

## 2022-11-05 LAB — RESULTS CONSOLE HPV: CHL HPV: NEGATIVE

## 2022-11-07 ENCOUNTER — Other Ambulatory Visit: Payer: Self-pay

## 2022-11-07 DIAGNOSIS — Z3689 Encounter for other specified antenatal screening: Secondary | ICD-10-CM

## 2022-11-20 ENCOUNTER — Other Ambulatory Visit: Payer: Self-pay

## 2022-11-20 ENCOUNTER — Ambulatory Visit
Admission: RE | Admit: 2022-11-20 | Discharge: 2022-11-20 | Disposition: A | Discharge status: Home / Self Care | Payer: BC Managed Care – PPO | Source: Ambulatory Visit

## 2022-11-20 DIAGNOSIS — O4692 Antepartum hemorrhage, unspecified, second trimester: Secondary | ICD-10-CM | POA: Diagnosis present

## 2022-11-20 DIAGNOSIS — N939 Abnormal uterine and vaginal bleeding, unspecified: Secondary | ICD-10-CM

## 2022-12-12 ENCOUNTER — Encounter: Payer: BC Managed Care – PPO | Admitting: *Deleted

## 2022-12-12 ENCOUNTER — Encounter: Payer: Self-pay | Admitting: *Deleted

## 2022-12-12 VITALS — BP 110/70 | Ht 62.0 in | Wt 230.7 lb

## 2022-12-12 DIAGNOSIS — O24410 Gestational diabetes mellitus in pregnancy, diet controlled: Secondary | ICD-10-CM | POA: Diagnosis present

## 2022-12-12 DIAGNOSIS — Z713 Dietary counseling and surveillance: Secondary | ICD-10-CM | POA: Diagnosis not present

## 2022-12-12 NOTE — Patient Instructions (Signed)
Read booklet on Gestational Diabetes Follow Gestational Meal Planning Guidelines Don't skip meals - eat 1 protein and 1 carbohydrate serving Avoid sugar sweetened drinks (Kool-Aid) Complete a 3 Day Food Record and bring to next appointment Check blood sugars 4 x day - before breakfast and 2 hrs after every meal and record  Bring blood sugar log to all appointments Purchase urine ketone strips if instructed by MD and check urine ketones every am:  If + increase bedtime snack to 1 protein and 2 carbohydrate servings Walk 20-30 minutes at least 5 x week if permitted by MD

## 2022-12-12 NOTE — Progress Notes (Signed)
Diabetes Self-Management Education  Visit Type: First/Initial  Appt. Start Time: 0830 Appt. End Time: [id number]  12/12/2022  Desiree Santos - Hammond, identified by name and date of birth, is a 37 y.o. female with a diagnosis of Diabetes: Gestational Diabetes.   ASSESSMENT  Blood pressure 110/70, height 5' 2"$  (1.575 m), weight 230 lb 11.2 oz (104.6 kg). Last menstrual period 08/04/2022, estimated date of delivery 05/17/2023 Body mass index is 42.2 kg/m.   Diabetes Self-Management Education - 12/12/22 1130       Visit Information   Visit Type First/Initial      Initial Visit   Diabetes Type Gestational Diabetes    Date Diagnosed 2 weeks    Are you currently following a meal plan? No    Are you taking your medications as prescribed? Yes      Santos Coping   How would you rate your overall Santos? Poor      Psychosocial Assessment   Patient Belief/Attitude about Diabetes Other (comment)   "ok"   What is the hardest part about your diabetes right now, causing you the most concern, or is the most worrisome to you about your diabetes?   Making healty food and beverage choices;Checking blood sugar    Self-care barriers None    Self-management support Doctor's office;Family    Other persons present Parent   mother   Patient Concerns Nutrition/Meal planning;Monitoring;Weight Control    Special Needs None    Preferred Learning Style Visual;Auditory    Learning Readiness Ready    How often do you need to have someone help you when you read instructions, pamphlets, or other written materials from your doctor or pharmacy? 1 - Never    What is the last grade level you completed in school? high school      Pre-Education Assessment   Patient understands the diabetes disease and treatment process. Needs Instruction    Patient understands incorporating nutritional management into lifestyle. Needs Instruction    Patient undertands incorporating physical activity into lifestyle. Needs Instruction     Patient understands using medications safely. Needs Instruction    Patient understands monitoring blood glucose, interpreting and using results Needs Instruction    Patient understands prevention, detection, and treatment of acute complications. Needs Instruction    Patient understands prevention, detection, and treatment of chronic complications. Needs Instruction    Patient understands how to develop strategies to address psychosocial issues. Needs Instruction    Patient understands how to develop strategies to promote Santos/change behavior. Needs Instruction      Complications   Last HgB A1C per patient/outside source 5.8 %   11/05/2022   How often do you check your blood sugar? 0 times/day (not testing)   Pt brought her glucometer from home and was instructed on use. BG in the office was 114 mg/dL at 9:35 am - fasting.   Have you had a dilated eye exam in the past 12 months? No    Have you had a dental exam in the past 12 months? Yes    Are you checking your feet? No      Dietary Intake   Breakfast skips    Lunch left overs    Snack (afternoon) nabs    Dinner chicken, beef; potatoes, peas, beans, corn, pasta, bread, green beans, brocolli, cauliflower, tomatoes, cuccumbers, zucchini, squash    Beverage(s) water, Kool-aid packets, Powerade zero      Activity / Exercise   Activity / Exercise Type ADL's      Patient  Education   Previous Diabetes Education No    Disease Pathophysiology Definition of diabetes, type 1 and 2, and the diagnosis of diabetes;Factors that contribute to the development of diabetes    Healthy Eating Role of diet in the treatment of diabetes and the relationship between the three main macronutrients and blood glucose level;Food label reading, portion sizes and measuring food.;Reviewed blood glucose goals for pre and post meals and how to evaluate the patients' food intake on their blood glucose level.    Being Active Role of exercise on diabetes management, blood  pressure control and cardiac Santos.    Medications Other (comment)   Limited use of oral medications during pregnancy and potential for insulin.   Monitoring Taught/evaluated SMBG meter.;Purpose and frequency of SMBG.;Taught/discussed recording of test results and interpretation of SMBG.;Identified appropriate SMBG and/or A1C goals.;Ketone testing, when, how.    Chronic complications Relationship between chronic complications and blood glucose control    Diabetes Stress and Support Identified and addressed patients feelings and concerns about diabetes;Role of stress on diabetes    Preconception care Pregnancy and GDM  Role of pre-pregnancy blood glucose control on the development of the fetus;Reviewed with patient blood glucose goals with pregnancy;Role of family planning for patients with diabetes      Individualized Goals (developed by patient)   Reducing Risk Other (comment)   prevent diabetes complications, lose weight     Outcomes   Expected Outcomes Demonstrated interest in learning. Expect positive outcomes         Individualized Plan for Diabetes Self-Management Training:   Learning Objective:  Patient will have a greater understanding of diabetes self-management. Patient education plan is to attend individual and/or group sessions per assessed needs and concerns.   Plan:   Read booklet on Gestational Diabetes Follow Gestational Meal Planning Guidelines Don't skip meals - eat 1 protein and 1 carbohydrate serving Avoid sugar sweetened drinks (Kool-Aid) Complete a 3 Day Food Record and bring to next appointment Check blood sugars 4 x day - before breakfast and 2 hrs after every meal and record  Bring blood sugar log to all appointments Purchase urine ketone strips if instructed by MD and check urine ketones every am:  If + increase bedtime snack to 1 protein and 2 carbohydrate servings Walk 20-30 minutes at least 5 x week if permitted by MD  Expected Outcomes:  Demonstrated  interest in learning. Expect positive outcomes  Education material provided:  Gestational Booklet Gestational Meal Planning Guidelines Simple Meal Plan 3 Day Food Record Goals for a Healthy Pregnancy  If problems or questions, patient to contact team via:   Johny Drilling, RN, [place], [place] [phone]  Future DSME appointment:  December 19, 2022 with the dietitian

## 2022-12-16 ENCOUNTER — Ambulatory Visit (HOSPITAL_BASED_OUTPATIENT_CLINIC_OR_DEPARTMENT_OTHER): Payer: BC Managed Care – PPO | Admitting: Obstetrics and Gynecology

## 2022-12-16 ENCOUNTER — Other Ambulatory Visit: Payer: Self-pay

## 2022-12-16 ENCOUNTER — Ambulatory Visit: Payer: BC Managed Care – PPO | Attending: Obstetrics and Gynecology

## 2022-12-16 DIAGNOSIS — O09522 Supervision of elderly multigravida, second trimester: Secondary | ICD-10-CM | POA: Insufficient documentation

## 2022-12-16 DIAGNOSIS — O99212 Obesity complicating pregnancy, second trimester: Secondary | ICD-10-CM | POA: Insufficient documentation

## 2022-12-16 DIAGNOSIS — O10019 Pre-existing essential hypertension complicating pregnancy, unspecified trimester: Secondary | ICD-10-CM | POA: Insufficient documentation

## 2022-12-16 DIAGNOSIS — E039 Hypothyroidism, unspecified: Secondary | ICD-10-CM | POA: Diagnosis not present

## 2022-12-16 DIAGNOSIS — Z3A18 18 weeks gestation of pregnancy: Secondary | ICD-10-CM | POA: Insufficient documentation

## 2022-12-16 DIAGNOSIS — O99282 Endocrine, nutritional and metabolic diseases complicating pregnancy, second trimester: Secondary | ICD-10-CM | POA: Diagnosis not present

## 2022-12-16 DIAGNOSIS — Z3689 Encounter for other specified antenatal screening: Secondary | ICD-10-CM

## 2022-12-16 DIAGNOSIS — O10012 Pre-existing essential hypertension complicating pregnancy, second trimester: Secondary | ICD-10-CM

## 2022-12-16 DIAGNOSIS — O24410 Gestational diabetes mellitus in pregnancy, diet controlled: Secondary | ICD-10-CM

## 2022-12-16 DIAGNOSIS — O285 Abnormal chromosomal and genetic finding on antenatal screening of mother: Secondary | ICD-10-CM | POA: Diagnosis not present

## 2022-12-16 DIAGNOSIS — O09212 Supervision of pregnancy with history of pre-term labor, second trimester: Secondary | ICD-10-CM

## 2022-12-16 NOTE — Progress Notes (Signed)
Maternal-Fetal Medicine   Name: Desiree Santos DOB: [birth year] MRN: [id number] Referring Provider: Drinda Butts, CNM  I had the pleasure of seeing Desiree Santos at the [facility], [facility]. She was accompanied by her partner. She is here for fetal anatomy scan and consultation. Her problems include: -Gestational diabetes.  Early screening confirmed gestational diabetes (1-hour GCT 204 mg/dL).  Recent hemoglobin A1c was 5.8%. Patient has just started checking her blood glucose and reports her fasting levels are in the 120s.  -Chronic hypertension.  Patient was taking labetalol which she discontinued because of normal blood pressures.  Blood pressure today at her office is 136/67 mmHg.  -Advanced maternal age.  On cell-free fetal Linea screening, the risks of fetal aneuploidies are not increased.  -Hypothyroidism.  Recent TSH level was very high (26.9IU/mL) and the patient takes thyroxine supplements. -Class III obesity.  Past surgical history: Tonsillectomy, laparoscopic cholecystectomy. Medications: Low-dose aspirin, levothyroxine.  Patient cannot tolerate prenatal vitamins. Allergies: No known drug allergies. Social history: Denies tobacco or drug or alcohol use.  Her partner is in good health.  Patient is a Print production planner. Family history: No history of venous thromboembolism in the family. Obstetric history is significant for a term vaginal delivery in 2014 of a female infant weighing 6 pounds and 7 ounces at birth.  Her pregnancy was uncomplicated. GYN history: No history of abnormal Pap smears or cervical surgeries.  No history of breast biopsies.  Her periods were irregular. Prenatal course: Her pregnancy is dated by 6-week ultrasound performed at your office.  Ultrasound We performed a fetal anatomical survey.  Amniotic fluid is normal good fetal activity seen.  Fetal biometry is consistent with the previously established  dates.  No markers of aneuploidy's or fetal structural defects are seen.  Fetal anatomical survey could not be completed because of fetal position and maternal body habitus.  As maternal obesity imposes limitations on the resolution of images, fetal anomalies may be missed.  Our concerns include: Gestational diabetes I explained the diagnosis of gestational diabetes.  I emphasized the importance of good blood glucose control to prevent adverse fetal or neonatal outcomes.  I discussed normal range of blood glucose values. I encouraged her to check her blood glucose regularly. Possible complications of gestational diabetes include fetal macrosomia, shoulder dystocia and birth injuries, stillbirth (in poorly controlled diabetes) and neonatal respiratory syndrome and other complications.  In about 85% of cases, gestational diabetes is well controlled by diet alone.  Exercise reduces the need for insulin.  Medical treatment includes oral hypoglycemics or insulin. If her fasting levels remain high, insulin should be initiated.  Type 2 diabetes develops in up to 50% of women with GDM. I recommend postpartum screening with 75-g glucose load at 6 to 12 weeks after delivery.   Chronic hypertension -Adverse outcomes of severe chronic hypertension include maternal stroke, endorgan damage, coagulation disturbances. Placental abruption is more common. -Superimposed preeclampsia occurs in more than 30% of women with chronic hypertension I discussed the benefit of low-dose aspirin prophylaxis that helps delaying or preventing preeclampsia. -Blood pressures tend to decrease in the second trimester. Patient has blood pressure cuff at home and is checking regularly. I discussed the normal parameters. -I discussed ultrasound protocol of monitoring fetal growth assessment and antenatal testing.  -Timing of delivery: Provided hypertension and diabetes are well controlled, she can be delivered at 20- or 39-weeks'  gestation.  Early term delivery is an option if they not well controlled.  Advanced maternal age I discussed the significance and limitations of cell-free fetal DNA screening that it has a greater detection rate for Down syndrome. I discussed amniocentesis that is confirmatory.  Recommendations -An appointment was made for her to return in 4 weeks for completion of fetal anatomy. -Fetal growth assessments every 4 weeks till delivery. -Weekly BPP from [redacted] weeks gestation till delivery. -Low-dose aspirin to continue. -Check TSH in 2 weeks.  Thank you for consultation.  If you have any questions or concerns, please contact me the [facility].  Consultation including face-to-face counseling (more than 50% of time spent) is 45 minutes.

## 2022-12-19 ENCOUNTER — Encounter: Payer: Self-pay | Admitting: Dietician

## 2022-12-19 ENCOUNTER — Encounter: Payer: BC Managed Care – PPO | Admitting: Dietician

## 2022-12-19 VITALS — BP 120/72 | Ht 62.0 in | Wt 229.4 lb

## 2022-12-19 DIAGNOSIS — O24410 Gestational diabetes mellitus in pregnancy, diet controlled: Secondary | ICD-10-CM | POA: Diagnosis not present

## 2022-12-19 NOTE — Progress Notes (Signed)
Patient's BG record indicates fasting BGs ranging 97-130, and post-meal BGs ranging 70-195. Most high BGs are after dinner, which is the largest meal and the only time of day she has hunger. Patient's food diary indicates infrequent eating due to nausea; protein sources with most meals.  Provided basic meal plan, and wrote individualized menus based on patient's food preferences. Discussed meal and snack options and foods that are easy to digest; encouraged trying cool, fresh foods to avoid odors; advised eating small meals/ snacks every 3-4 hours even if just a few bites; discussed liquid options as meal replacements Instructed patient on food safety, including avoidance of Listeriosis, and limiting mercury from fish. Discussed importance of maintaining healthy lifestyle habits to reduce risk of Type 2 DM as well as Gestational DM with any future pregnancies. Advised patient to use any remaining testing supplies to test some BGs after delivery, and to have BG tested ideally annually, as well as prior to attempting future pregnancies.

## 2022-12-19 NOTE — Patient Instructions (Signed)
Eat a meal or snack every 3-4 hours in the day as able; keep meals and snacks small, ideally at least a portion of a protein food (or drink) and a starch or fruit. If unable to eat anything, try sipping slowly on a juice or ginger ale or sprite to prevent blood sugar from dropping too low. Limit to about 2-4oz every 30 minutes.  Drink plenty of water/ powerade zero in between meals to stay well hydrated.

## 2022-12-25 ENCOUNTER — Other Ambulatory Visit: Payer: Self-pay | Admitting: Family

## 2022-12-25 DIAGNOSIS — E039 Hypothyroidism, unspecified: Secondary | ICD-10-CM

## 2022-12-25 NOTE — Telephone Encounter (Signed)
Can we ask pt if the GYN took this over for management of thyroid? Last I spoke with them they would be taking this over while pregnant.

## 2022-12-25 NOTE — Telephone Encounter (Signed)
Refill request for levothyroxine (SYNTHROID) 88 MCG tablet  LV- 07/28/22 LR- 10/28/22 (30tabs/1refill) NV- not scheduled Ok to fill medication?

## 2022-12-26 NOTE — Telephone Encounter (Signed)
Left message to return call to our office.  

## 2022-12-29 ENCOUNTER — Telehealth: Payer: Self-pay

## 2022-12-29 ENCOUNTER — Other Ambulatory Visit: Payer: Self-pay | Admitting: Family

## 2022-12-29 DIAGNOSIS — E039 Hypothyroidism, unspecified: Secondary | ICD-10-CM

## 2022-12-29 NOTE — Telephone Encounter (Signed)
Left a detailed message (DPR) advising the patient to contact her GYN for this medication refill.

## 2022-12-29 NOTE — Telephone Encounter (Signed)
Left message for patient to call office to reschedule 01/13/23 ultrasound at [facility] to 3/18 or a Wednesday - due to Schedule Change

## 2022-12-29 NOTE — Telephone Encounter (Signed)
Please advise pt that she will get this from her Gyn.

## 2022-12-31 ENCOUNTER — Other Ambulatory Visit: Payer: Self-pay | Admitting: Family

## 2022-12-31 DIAGNOSIS — E039 Hypothyroidism, unspecified: Secondary | ICD-10-CM

## 2023-01-07 ENCOUNTER — Other Ambulatory Visit: Payer: Self-pay

## 2023-01-07 DIAGNOSIS — O24410 Gestational diabetes mellitus in pregnancy, diet controlled: Secondary | ICD-10-CM

## 2023-01-07 DIAGNOSIS — E039 Hypothyroidism, unspecified: Secondary | ICD-10-CM

## 2023-01-07 DIAGNOSIS — I10 Essential (primary) hypertension: Secondary | ICD-10-CM

## 2023-01-07 DIAGNOSIS — O09522 Supervision of elderly multigravida, second trimester: Secondary | ICD-10-CM

## 2023-01-12 ENCOUNTER — Ambulatory Visit: Payer: BC Managed Care – PPO

## 2023-01-13 ENCOUNTER — Ambulatory Visit: Payer: BC Managed Care – PPO

## 2023-02-20 ENCOUNTER — Other Ambulatory Visit: Payer: Self-pay | Admitting: Obstetrics

## 2023-02-20 ENCOUNTER — Observation Stay
Admission: EM | Admit: 2023-02-20 | Discharge: 2023-02-20 | Disposition: A | Discharge status: Home / Self Care | Payer: BC Managed Care – PPO | Attending: Obstetrics | Admitting: Obstetrics

## 2023-02-20 ENCOUNTER — Encounter: Payer: Self-pay | Admitting: Obstetrics and Gynecology

## 2023-02-20 ENCOUNTER — Other Ambulatory Visit: Payer: Self-pay

## 2023-02-20 DIAGNOSIS — O368120 Decreased fetal movements, second trimester, not applicable or unspecified: Principal | ICD-10-CM | POA: Insufficient documentation

## 2023-02-20 DIAGNOSIS — O10012 Pre-existing essential hypertension complicating pregnancy, second trimester: Secondary | ICD-10-CM | POA: Diagnosis not present

## 2023-02-20 DIAGNOSIS — O99282 Endocrine, nutritional and metabolic diseases complicating pregnancy, second trimester: Secondary | ICD-10-CM | POA: Diagnosis not present

## 2023-02-20 DIAGNOSIS — Z87891 Personal history of nicotine dependence: Secondary | ICD-10-CM | POA: Diagnosis not present

## 2023-02-20 DIAGNOSIS — Z794 Long term (current) use of insulin: Secondary | ICD-10-CM | POA: Insufficient documentation

## 2023-02-20 DIAGNOSIS — D509 Iron deficiency anemia, unspecified: Secondary | ICD-10-CM

## 2023-02-20 DIAGNOSIS — O99012 Anemia complicating pregnancy, second trimester: Secondary | ICD-10-CM

## 2023-02-20 DIAGNOSIS — Z3A27 27 weeks gestation of pregnancy: Secondary | ICD-10-CM | POA: Diagnosis not present

## 2023-02-20 DIAGNOSIS — Z79899 Other long term (current) drug therapy: Secondary | ICD-10-CM | POA: Insufficient documentation

## 2023-02-20 DIAGNOSIS — E039 Hypothyroidism, unspecified: Secondary | ICD-10-CM | POA: Insufficient documentation

## 2023-02-20 DIAGNOSIS — Z7982 Long term (current) use of aspirin: Secondary | ICD-10-CM | POA: Diagnosis not present

## 2023-02-20 DIAGNOSIS — O368190 Decreased fetal movements, unspecified trimester, not applicable or unspecified: Secondary | ICD-10-CM | POA: Diagnosis present

## 2023-02-20 NOTE — Progress Notes (Signed)
Maternal iron deficiency anemia at 27.[redacted] weeks gestation. Hgb 9.9. I have ordered iron transfusions weekly x 4   CNM

## 2023-02-20 NOTE — OB Triage Note (Signed)
Pt co decreased fetal movement. Last felt baby move @ 0800 this am. Monitor applied. FHR present. Audible movement noted. Pt has since felt baby move. Elaina Hoops

## 2023-02-20 NOTE — OB Triage Note (Signed)
Discharge home. Left floor ambulatory with mother. ,  S  

## 2023-02-20 NOTE — Discharge Summary (Signed)
Desiree Santos is a 37 y.o. female. She is at [redacted]w[redacted]d gestation. No LMP recorded (lmp unknown). Patient is pregnant. Estimated Date of Delivery: 05/17/23  Prenatal care site: [facility]  Chief complaint: decreased FM  HPI: Desiree Santos presents to L&D with complaints of decreased FM since 0800  Factors complicating pregnancy: Chronic hypertension AMA GDM A2 Hypothyroidism Obesity Hx of retained placenta  S: Resting comfortably. no CTX, no VB.no LOF,  Active fetal movement.   Maternal Medical History:  Past Medical Hx:  has a past medical history of Chicken pox, Elevated BP without diagnosis of hypertension, GERD (gastroesophageal reflux disease), Gestational diabetes, Headache, History of kidney stones, History of UTI, and Thyroid disease.    Past Surgical Hx:  has a past surgical history that includes Cholecystectomy; Tonsillectomy; Kidney surgery (2002); and Wisdom tooth extraction (Right, 10/20/2022).   No Known Allergies   Prior to Admission medications   Medication Sig Start Date End Date Taking? Authorizing Provider  aspirin 81 MG chewable tablet Chew 81 mg by mouth daily.   Yes [provider]  insulin glargine (LANTUS) 100 UNIT/ML injection Inject 35 Units into the skin in the morning.   Yes [provider]  insulin glargine (LANTUS) 100 UNIT/ML injection Inject 25 Units into the skin at bedtime.   Yes [provider]  levothyroxine (SYNTHROID) 100 MCG tablet Take one 100 mcg tablet along with one 88 mcg tablet for total of 188 mcg once daily. Do not take with food or other medications for at least thirty minutes. 10/28/22  Yes Dugal, Wyatt Mage, FNP  levothyroxine (SYNTHROID) 75 MCG tablet Take 75 mcg by mouth at bedtime.   Yes [provider]  omeprazole (PRILOSEC) 40 MG capsule Take 40 mg by mouth daily. 11/05/22 11/05/23 Yes [provider]  labetalol (NORMODYNE) 200 MG tablet Take 1 tablet by mouth 2 (two) times  daily. Patient not taking: Reported on 12/12/2022 09/25/22 09/25/23  [provider]  levothyroxine (SYNTHROID) 88 MCG tablet Take one 88 mcg tablet along with one 100 mcg tablet for total of 188 mcg once daily. Do not take with food or other medications for at least thirty minutes. Patient not taking: Reported on 02/20/2023 10/28/22   Mort Sawyers, FNP  PRECISION QID TEST test strip 1 each (1 strip total) 4 (four) times daily Use as instructed. Please fill one covered by patient's insurance 12/05/22 12/05/23  [provider]    Social History: She  reports that she has quit smoking. Her smoking use included cigarettes. She has never used smokeless tobacco. She reports that she does not drink alcohol and does not use drugs.  Family History: family history includes Arthritis in her maternal grandmother; Breast cancer in her paternal grandmother; Cancer in her maternal grandmother, paternal grandfather, and paternal grandmother; Dementia in her maternal grandmother; Depression in her maternal grandmother and paternal grandfather; Diabetes in her paternal grandfather; Early death in her paternal grandfather and paternal grandmother; Heart disease in her father and paternal grandfather; Hyperlipidemia in her mother and paternal grandfather; Hypertension in her maternal grandfather, maternal grandmother, mother, and paternal grandfather; Sudden Cardiac Death in her father. ,no history of gyn cancers  Review of Systems: A full review of systems was performed and negative except as noted in the HPI.    O:  Ht 5\' 2"  (1.575 m)   Wt 104.3 kg   LMP  (LMP Unknown)   BMI 42.07 kg/m  No results found for this or any previous visit (from  the past 48 hour(s)).   Constitutional: NAD, AAOx3  HE/ENT: extraocular movements grossly intact, moist mucous membranes CV: RRR PULM: nl respiratory effort, CTABL Abd: gravid, non-tender, non-distended, soft  Ext: Non-tender, Nonedmeatous Psych: mood  appropriate, speech normal Pelvic : deferred SVE:     Fetal Monitor: Baseline: 145 bpm Variability: moderate Accels: Present Decels:  variable, appropriate for gestational age Toco: none  Category: II   Assessment: 37 y.o. [redacted]w[redacted]d here for antenatal surveillance during pregnancy.  Principle diagnosis:  There were no encounter diagnoses.   Plan: Labor: not present.  Fetal Wellbeing: Reassuring Cat 1 tracing. Reactive NST  D/c home stable, precautions reviewed, follow-up as scheduled.   ----- Chari Manning, CNM Certified Nurse Midwife [facility]

## 2023-03-04 ENCOUNTER — Ambulatory Visit
Admission: RE | Admit: 2023-03-04 | Discharge: 2023-03-04 | Disposition: A | Discharge status: Home / Self Care | Payer: BC Managed Care – PPO | Source: Ambulatory Visit | Attending: Obstetrics | Admitting: Obstetrics

## 2023-03-04 DIAGNOSIS — D509 Iron deficiency anemia, unspecified: Secondary | ICD-10-CM | POA: Insufficient documentation

## 2023-03-04 DIAGNOSIS — Z3A29 29 weeks gestation of pregnancy: Secondary | ICD-10-CM | POA: Diagnosis not present

## 2023-03-04 DIAGNOSIS — O99012 Anemia complicating pregnancy, second trimester: Secondary | ICD-10-CM | POA: Diagnosis not present

## 2023-03-04 MED ORDER — SODIUM CHLORIDE 0.9 % IV SOLN
300.0000 mg | INTRAVENOUS | Status: DC
  Administered 2023-03-04: 300 mg via INTRAVENOUS
  Filled 2023-03-04: qty 300

## 2023-03-11 ENCOUNTER — Ambulatory Visit
Admission: RE | Admit: 2023-03-11 | Discharge: 2023-03-11 | Disposition: A | Discharge status: Home / Self Care | Payer: BC Managed Care – PPO | Source: Ambulatory Visit | Attending: Obstetrics | Admitting: Obstetrics

## 2023-03-11 DIAGNOSIS — O99012 Anemia complicating pregnancy, second trimester: Secondary | ICD-10-CM | POA: Insufficient documentation

## 2023-03-11 DIAGNOSIS — D509 Iron deficiency anemia, unspecified: Secondary | ICD-10-CM | POA: Insufficient documentation

## 2023-03-11 MED ORDER — SODIUM CHLORIDE 0.9 % IV SOLN
300.0000 mg | Freq: Once | INTRAVENOUS | Status: AC
  Administered 2023-03-11: 300 mg via INTRAVENOUS
  Filled 2023-03-11: qty 300

## 2023-03-19 ENCOUNTER — Ambulatory Visit
Admission: RE | Admit: 2023-03-19 | Discharge: 2023-03-19 | Disposition: A | Discharge status: Home / Self Care | Payer: BC Managed Care – PPO | Source: Ambulatory Visit | Attending: Obstetrics | Admitting: Obstetrics

## 2023-03-19 DIAGNOSIS — D509 Iron deficiency anemia, unspecified: Secondary | ICD-10-CM | POA: Diagnosis not present

## 2023-03-19 DIAGNOSIS — O99012 Anemia complicating pregnancy, second trimester: Secondary | ICD-10-CM | POA: Insufficient documentation

## 2023-03-19 DIAGNOSIS — Z3A00 Weeks of gestation of pregnancy not specified: Secondary | ICD-10-CM | POA: Insufficient documentation

## 2023-03-19 MED ORDER — SODIUM CHLORIDE 0.9 % IV SOLN
300.0000 mg | Freq: Once | INTRAVENOUS | Status: AC
  Administered 2023-03-19: 300 mg via INTRAVENOUS
  Filled 2023-03-19: qty 300

## 2023-03-25 ENCOUNTER — Ambulatory Visit
Admission: RE | Admit: 2023-03-25 | Discharge: 2023-03-25 | Disposition: A | Discharge status: Home / Self Care | Payer: BC Managed Care – PPO | Source: Ambulatory Visit | Attending: Obstetrics | Admitting: Obstetrics

## 2023-03-25 MED ORDER — SODIUM CHLORIDE 0.9 % IV SOLN
300.0000 mg | Freq: Once | INTRAVENOUS | Status: DC
  Filled 2023-03-25: qty 15

## 2023-03-30 NOTE — Addendum Note (Signed)
Encounter addended by: Rosalita Chessman, RN on: 03/30/2023 11:19 AM  Actions taken: Charge Capture section accepted

## 2023-04-20 LAB — HIV ANTIBODY (ROUTINE TESTING W REFLEX): HIV 1&2 Ab, 4th Generation: NEGATIVE

## 2023-04-20 LAB — OB RESULTS CONSOLE GC/CHLAMYDIA: Chlamydia: NEGATIVE

## 2023-04-22 ENCOUNTER — Other Ambulatory Visit: Payer: Self-pay | Admitting: Obstetrics and Gynecology

## 2023-04-22 DIAGNOSIS — O10919 Unspecified pre-existing hypertension complicating pregnancy, unspecified trimester: Secondary | ICD-10-CM

## 2023-04-22 NOTE — Progress Notes (Signed)
BIBIANA GILLEAN is a 37 y.o. G68P1001 female at [redacted]w[redacted]d dated by u/s.  She presents to L&D for IOL for [facility]  Pregnancy Issues: 1. Positive AFP 2. CHTN 3. AMA 4. Obesity 5. Hypothyroidism 6. Early GDM 7. H/o retained placenta 8. Anemia   Prenatal care site: [facility]   EFW: 04/20/23 [id number] [phone])  Pertinent Results:  Prenatal Labs: Blood type/Rh O pos  Antibody screen neg  Rubella Immune  Varicella Immune  RPR NR  HBsAg Neg  HIV NR  GC neg  Chlamydia neg  Genetic screening MaterniT21 neg and AFP Pos  1 hour GTT Early 204  3 hour GTT   GBS neg    5. Post Partum Planning: - Infant feeding: Bottle - Contraception: BTL - Tdap not given   Haroldine Laws, CNM 04/22/2023 6:44 PM

## 2023-04-28 ENCOUNTER — Other Ambulatory Visit: Payer: Self-pay

## 2023-04-28 ENCOUNTER — Inpatient Hospital Stay
Admission: EM | Admit: 2023-04-28 | Discharge: 2023-04-30 | DRG: 807 | Disposition: A | Discharge status: Home / Self Care | Payer: BC Managed Care – PPO

## 2023-04-28 ENCOUNTER — Encounter: Payer: Self-pay | Admitting: Obstetrics and Gynecology

## 2023-04-28 DIAGNOSIS — O10919 Unspecified pre-existing hypertension complicating pregnancy, unspecified trimester: Principal | ICD-10-CM | POA: Diagnosis present

## 2023-04-28 DIAGNOSIS — Z87891 Personal history of nicotine dependence: Secondary | ICD-10-CM | POA: Diagnosis not present

## 2023-04-28 DIAGNOSIS — O9902 Anemia complicating childbirth: Secondary | ICD-10-CM | POA: Diagnosis present

## 2023-04-28 DIAGNOSIS — O24424 Gestational diabetes mellitus in childbirth, insulin controlled: Secondary | ICD-10-CM | POA: Diagnosis present

## 2023-04-28 DIAGNOSIS — E039 Hypothyroidism, unspecified: Secondary | ICD-10-CM | POA: Diagnosis present

## 2023-04-28 DIAGNOSIS — O134 Gestational [pregnancy-induced] hypertension without significant proteinuria, complicating childbirth: Secondary | ICD-10-CM | POA: Diagnosis present

## 2023-04-28 DIAGNOSIS — O99214 Obesity complicating childbirth: Secondary | ICD-10-CM | POA: Diagnosis present

## 2023-04-28 DIAGNOSIS — O1092 Unspecified pre-existing hypertension complicating childbirth: Secondary | ICD-10-CM | POA: Diagnosis present

## 2023-04-28 DIAGNOSIS — Z79890 Hormone replacement therapy (postmenopausal): Secondary | ICD-10-CM

## 2023-04-28 DIAGNOSIS — Z8249 Family history of ischemic heart disease and other diseases of the circulatory system: Secondary | ICD-10-CM

## 2023-04-28 DIAGNOSIS — Z3A37 37 weeks gestation of pregnancy: Secondary | ICD-10-CM | POA: Diagnosis not present

## 2023-04-28 DIAGNOSIS — Z7982 Long term (current) use of aspirin: Secondary | ICD-10-CM

## 2023-04-28 DIAGNOSIS — O99284 Endocrine, nutritional and metabolic diseases complicating childbirth: Secondary | ICD-10-CM | POA: Diagnosis present

## 2023-04-28 LAB — CBC
HCT: 35 % — ABNORMAL LOW (ref 36.0–46.0)
Hemoglobin: 12.2 g/dL (ref 12.0–15.0)
MCH: 28.8 pg (ref 26.0–34.0)
MCHC: 34.9 g/dL (ref 30.0–36.0)
MCV: 82.7 fL (ref 80.0–100.0)
Platelets: 221 10*3/uL (ref 150–400)
RBC: 4.23 MIL/uL (ref 3.87–5.11)
RDW: 15.9 % — ABNORMAL HIGH (ref 11.5–15.5)
WBC: 9.6 10*3/uL (ref 4.0–10.5)
nRBC: 0 % (ref 0.0–0.2)

## 2023-04-28 LAB — TYPE AND SCREEN
ABO/RH(D): O POS
Antibody Screen: NEGATIVE

## 2023-04-28 LAB — GLUCOSE, CAPILLARY
Glucose-Capillary: 85 mg/dL (ref 70–99)
Glucose-Capillary: 78 mg/dL (ref 70–99)
Glucose-Capillary: 81 mg/dL (ref 70–99)
Glucose-Capillary: 84 mg/dL (ref 70–99)

## 2023-04-28 LAB — ABO/RH: ABO/RH(D): O POS

## 2023-04-28 LAB — RPR: RPR Ser Ql: NONREACTIVE

## 2023-04-28 MED ORDER — LIDOCAINE HCL (PF) 1 % IJ SOLN
INTRAMUSCULAR | Status: AC
  Administered 2023-04-28: 30 mL via SUBCUTANEOUS
  Filled 2023-04-28: qty 30

## 2023-04-28 MED ORDER — SODIUM CHLORIDE 0.9% FLUSH: 3.0000 mL | INTRAVENOUS | Status: DC | PRN

## 2023-04-28 MED ORDER — MISOPROSTOL 25 MCG QUARTER TABLET
25.0000 ug | ORAL_TABLET | ORAL | Status: DC
  Administered 2023-04-28: 25 ug via VAGINAL
  Filled 2023-04-28: qty 1

## 2023-04-28 MED ORDER — OXYCODONE HCL 5 MG PO TABS
5.0000 mg | ORAL_TABLET | ORAL | Status: DC | PRN
  Administered 2023-04-28: 5 mg via ORAL
  Filled 2023-04-28: qty 1

## 2023-04-28 MED ORDER — OXYTOCIN-SODIUM CHLORIDE 30-0.9 UT/500ML-% IV SOLN: 2.5000 [IU]/h | INTRAVENOUS | Status: DC

## 2023-04-28 MED ORDER — CEFAZOLIN SODIUM-DEXTROSE 2-4 GM/100ML-% IV SOLN: 2.0000 g | Freq: Three times a day (TID) | INTRAVENOUS | Status: DC

## 2023-04-28 MED ORDER — SOD CITRATE-CITRIC ACID 500-334 MG/5ML PO SOLN: 30.0000 mL | ORAL | Status: DC | PRN

## 2023-04-28 MED ORDER — SODIUM CHLORIDE 0.9% FLUSH
3.0000 mL | Freq: Two times a day (BID) | INTRAVENOUS | Status: DC
  Administered 2023-04-28 – 2023-04-29 (×3): 3 mL via INTRAVENOUS

## 2023-04-28 MED ORDER — OXYCODONE-ACETAMINOPHEN 5-325 MG PO TABS: 1.0000 | ORAL_TABLET | ORAL | Status: DC | PRN

## 2023-04-28 MED ORDER — LIDOCAINE HCL (PF) 1 % IJ SOLN: 30.0000 mL | INTRAMUSCULAR | Status: AC | PRN

## 2023-04-28 MED ORDER — FLEET ENEMA 7-19 GM/118ML RE ENEM: 1.0000 | ENEMA | Freq: Every day | RECTAL | Status: DC | PRN

## 2023-04-28 MED ORDER — TRANEXAMIC ACID-NACL 1000-0.7 MG/100ML-% IV SOLN
INTRAVENOUS | Status: AC
  Administered 2023-04-28: 1000 mg via INTRAVENOUS
  Filled 2023-04-28: qty 100

## 2023-04-28 MED ORDER — AMMONIA AROMATIC IN INHA
RESPIRATORY_TRACT | Status: AC
  Filled 2023-04-28: qty 10

## 2023-04-28 MED ORDER — LACTATED RINGERS IV SOLN: INTRAVENOUS | Status: DC

## 2023-04-28 MED ORDER — TRANEXAMIC ACID-NACL 1000-0.7 MG/100ML-% IV SOLN: 1000.0000 mg | INTRAVENOUS | Status: AC

## 2023-04-28 MED ORDER — OXYTOCIN-SODIUM CHLORIDE 30-0.9 UT/500ML-% IV SOLN
1.0000 m[IU]/min | INTRAVENOUS | Status: DC
  Administered 2023-04-28: 2 m[IU]/min via INTRAVENOUS

## 2023-04-28 MED ORDER — OXYCODONE-ACETAMINOPHEN 5-325 MG PO TABS: 2.0000 | ORAL_TABLET | ORAL | Status: DC | PRN

## 2023-04-28 MED ORDER — TERBUTALINE SULFATE 1 MG/ML IJ SOLN: 0.2500 mg | Freq: Once | INTRAMUSCULAR | Status: DC | PRN

## 2023-04-28 MED ORDER — DIBUCAINE (PERIANAL) 1 % EX OINT: 1.0000 | TOPICAL_OINTMENT | CUTANEOUS | Status: DC | PRN

## 2023-04-28 MED ORDER — ONDANSETRON HCL 4 MG/2ML IJ SOLN: 4.0000 mg | INTRAMUSCULAR | Status: DC | PRN

## 2023-04-28 MED ORDER — PRENATAL MULTIVITAMIN CH
1.0000 | ORAL_TABLET | Freq: Every day | ORAL | Status: DC
  Administered 2023-04-29: 1 via ORAL
  Filled 2023-04-28: qty 1

## 2023-04-28 MED ORDER — SENNOSIDES-DOCUSATE SODIUM 8.6-50 MG PO TABS
2.0000 | ORAL_TABLET | ORAL | Status: DC
  Administered 2023-04-28: 2 via ORAL
  Filled 2023-04-28 (×2): qty 2

## 2023-04-28 MED ORDER — ZOLPIDEM TARTRATE 5 MG PO TABS: 5.0000 mg | ORAL_TABLET | Freq: Every evening | ORAL | Status: DC | PRN

## 2023-04-28 MED ORDER — LACTATED RINGERS IV SOLN: 500.0000 mL | INTRAVENOUS | Status: DC | PRN

## 2023-04-28 MED ORDER — SODIUM CHLORIDE 0.9 % IV SOLN: 250.0000 mL | INTRAVENOUS | Status: DC | PRN

## 2023-04-28 MED ORDER — BISACODYL 10 MG RE SUPP: 10.0000 mg | Freq: Every day | RECTAL | Status: DC | PRN

## 2023-04-28 MED ORDER — FENTANYL CITRATE (PF) 100 MCG/2ML IJ SOLN: 100.0000 ug | Freq: Once | INTRAMUSCULAR | Status: AC

## 2023-04-28 MED ORDER — FENTANYL CITRATE (PF) 100 MCG/2ML IJ SOLN: 50.0000 ug | INTRAMUSCULAR | Status: DC | PRN

## 2023-04-28 MED ORDER — OXYTOCIN 10 UNIT/ML IJ SOLN
INTRAMUSCULAR | Status: AC
  Filled 2023-04-28: qty 2

## 2023-04-28 MED ORDER — ACETAMINOPHEN 325 MG PO TABS
650.0000 mg | ORAL_TABLET | ORAL | Status: DC | PRN
  Administered 2023-04-28 – 2023-04-29 (×3): 650 mg via ORAL
  Filled 2023-04-28 (×3): qty 2

## 2023-04-28 MED ORDER — IBUPROFEN 600 MG PO TABS
600.0000 mg | ORAL_TABLET | Freq: Four times a day (QID) | ORAL | Status: DC
  Administered 2023-04-28 – 2023-04-30 (×5): 600 mg via ORAL
  Filled 2023-04-28 (×5): qty 1

## 2023-04-28 MED ORDER — OXYTOCIN BOLUS FROM INFUSION
333.0000 mL | Freq: Once | INTRAVENOUS | Status: AC
  Administered 2023-04-28: 333 mL via INTRAVENOUS

## 2023-04-28 MED ORDER — MISOPROSTOL 200 MCG PO TABS: 800.0000 ug | ORAL_TABLET | Freq: Once | ORAL | Status: AC

## 2023-04-28 MED ORDER — BENZOCAINE-MENTHOL 20-0.5 % EX AERO
1.0000 | INHALATION_SPRAY | CUTANEOUS | Status: DC | PRN
  Administered 2023-04-28: 1 via TOPICAL
  Filled 2023-04-28: qty 56

## 2023-04-28 MED ORDER — WITCH HAZEL-GLYCERIN EX PADS
1.0000 | MEDICATED_PAD | CUTANEOUS | Status: DC | PRN
  Administered 2023-04-28: 1 via TOPICAL
  Filled 2023-04-28: qty 100

## 2023-04-28 MED ORDER — ONDANSETRON HCL 4 MG/2ML IJ SOLN: 4.0000 mg | Freq: Four times a day (QID) | INTRAMUSCULAR | Status: DC | PRN

## 2023-04-28 MED ORDER — SIMETHICONE 80 MG PO CHEW: 80.0000 mg | CHEWABLE_TABLET | ORAL | Status: DC | PRN

## 2023-04-28 MED ORDER — CEFAZOLIN SODIUM-DEXTROSE 2-4 GM/100ML-% IV SOLN
INTRAVENOUS | Status: AC
  Administered 2023-04-28: 2 g via INTRAVENOUS
  Filled 2023-04-28: qty 100

## 2023-04-28 MED ORDER — OXYTOCIN-SODIUM CHLORIDE 30-0.9 UT/500ML-% IV SOLN
INTRAVENOUS | Status: AC
  Filled 2023-04-28: qty 500

## 2023-04-28 MED ORDER — FENTANYL CITRATE (PF) 100 MCG/2ML IJ SOLN
INTRAMUSCULAR | Status: AC
  Administered 2023-04-28: 100 ug via INTRAVENOUS
  Filled 2023-04-28: qty 2

## 2023-04-28 MED ORDER — ACETAMINOPHEN 325 MG PO TABS: 650.0000 mg | ORAL_TABLET | ORAL | Status: DC | PRN

## 2023-04-28 MED ORDER — MISOPROSTOL 200 MCG PO TABS
ORAL_TABLET | ORAL | Status: AC
  Administered 2023-04-28: 800 ug via RECTAL
  Filled 2023-04-28: qty 4

## 2023-04-28 MED ORDER — DIPHENHYDRAMINE HCL 25 MG PO CAPS: 25.0000 mg | ORAL_CAPSULE | Freq: Four times a day (QID) | ORAL | Status: DC | PRN

## 2023-04-28 MED ORDER — COCONUT OIL OIL: 1.0000 | TOPICAL_OIL | Status: DC | PRN

## 2023-04-28 MED ORDER — MISOPROSTOL 25 MCG QUARTER TABLET
25.0000 ug | ORAL_TABLET | ORAL | Status: DC
  Administered 2023-04-28: 25 ug via ORAL
  Filled 2023-04-28 (×2): qty 1

## 2023-04-28 MED ORDER — ONDANSETRON HCL 4 MG PO TABS: 4.0000 mg | ORAL_TABLET | ORAL | Status: DC | PRN

## 2023-04-28 NOTE — Progress Notes (Signed)
Difficult to trace [place], RN frequently at bedside to adjust from 0821 to [phone]. Wireless EFM removed and RN attempted to place [place] for monitoring. Despite multiple attempts and correct placement verified, Desiree Santos was unable to continuously monitor the FHR. Monica removed at 0935 and wireless monitors replaced with additional support from belly band. Plan to request placement of internal monitors if Santos and ctx remain difficult trace. Rubye Oaks CNM aware.

## 2023-04-28 NOTE — Discharge Summary (Signed)
Obstetrical Discharge Summary  Patient Name: Desiree Santos DOB: 1986/10/14 MRN: [id number]  Date of Admission: 04/28/2023 Date of Delivery: 04/28/23 Delivered by: Chari Manning, CNM  Date of Discharge: 04/30/2023  Primary OB: [facility] LMP:No LMP recorded (lmp unknown). EDC Estimated Date of Delivery: 05/17/23 Gestational Age at Delivery: [redacted]w[redacted]d   Antepartum complications:  1. Positive AFP 2. CHTN 3. AMA 4. Obesity 5. Hypothyroidism 6. Early GDM 7. H/o retained placenta 8. Anemia  Admitting Diagnosis: Chronic hypertension affecting pregnancy [O10.919]  Secondary Diagnosis: Patient Active Problem List   Diagnosis Date Noted   Chronic hypertension affecting pregnancy 04/28/2023   Decreased fetal movement 02/20/2023   [redacted] weeks gestation of pregnancy 10/28/2022   Supervision of high risk pregnancy in second trimester 09/25/2022   Supervision of high risk pregnancy in third trimester 09/25/2022   Primary hypertension 07/21/2022   Iron deficiency anemia 07/14/2022   Chronic migraine 02/05/2017   Nonintractable headache 02/03/2017   Morbid obesity with BMI of 40.0-44.9, adult (HCC) 11/05/2016   Hypothyroidism 09/24/2016   Gastroesophageal reflux disease without esophagitis 09/24/2016   HLD (hyperlipidemia) 09/24/2016    Discharge Diagnosis: Term Pregnancy Delivered, CHTN, and GDM A1      Augmentation: AROM, Pitocin, Cytotec, and IP Foley Complications: None Intrapartum complications/course: Desiree Santos presented to L&D with CHTN. She was 2.5/60/-3. She progressed  to C/C/+2 with a spontaneous urge to push.  She pushed  effectively over approximately 4 minutes for a spontaneous vaginal birth.  Delivery of a viable baby boy Delivery Type: spontaneous vaginal delivery Anesthesia: non-pharmacological methods Placenta: spontaneous To Pathology: No  Laceration: 1st degree and labial Episiotomy: none Newborn Data: Live born female  Birth Weight:   APGAR: ,   Newborn  Delivery   Birth date/time: 04/28/2023 19:45:00 Delivery type: Vaginal, Spontaneous     Postpartum Procedures: none Edinburgh:     04/29/2023    3:00 PM  Edinburgh Postnatal Depression Scale Screening Tool  I have been able to laugh and see the funny side of things. 0  I have looked forward with enjoyment to things. 0  I have blamed myself unnecessarily when things went wrong. 0  I have been anxious or worried for no good reason. 0  I have felt scared or panicky for no good reason. 0  Things have been getting on top of me. 0  I have been so unhappy that I have had difficulty sleeping. 0  I have felt sad or miserable. 0  I have been so unhappy that I have been crying. 0  The thought of harming myself has occurred to me. 0  Edinburgh Postnatal Depression Scale Total 0     Post partum course:  Patient had an uncomplicated postpartum course.  By time of discharge on PPD#2, her pain was controlled on oral pain medications; she had appropriate lochia and was ambulating, voiding without difficulty and tolerating regular diet.  She was deemed stable for discharge to home.     Discharge Physical Exam:  BP 128/68 (BP Location: Left Arm)   Pulse (!) 58   Temp 98 F (36.7 C) (Oral)   Resp 18   Ht 5\' 2"  (1.575 m)   Wt 104.3 kg   LMP  (LMP Unknown)   SpO2 99%   Breastfeeding Unknown   BMI 42.07 kg/m   General: NAD CV: RRR Pulm: CTABL, nl effort ABD: s/nd/nt, fundus firm and below the umbilicus Lochia: moderate Perineum: minimal edema/intact DVT Evaluation: LE non-ttp, no evidence of DVT on  exam.  Hemoglobin  Date Value Ref Range Status  04/29/2023 10.5 (L) 12.0 - 15.0 g/dL Final   HGB  Date Value Ref Range Status  04/16/2013 10.7 (L) 12.0 - 16.0 g/dL Final   HCT  Date Value Ref Range Status  04/29/2023 30.9 (L) 36.0 - 46.0 % Final  04/17/2013 27.7 (L) 35.0 - 47.0 % Final    Risk assessment for postpartum VTE and prophylactic treatment: Very high risk factors: None High  risk factors: None Moderate risk factors: BMI 30-40 kg/m2  Postpartum VTE prophylaxis with LMWH not indicated  Disposition: stable, discharge to home. Baby Feeding: formula feeding Baby Disposition: home with mom  Rh Immune globulin indicated: No Rubella vaccine given: was not indicated Varivax vaccine given: was not indicated Flu vaccine given in AP setting: No Tdap vaccine given in AP setting: No  Contraception: bilateral tubal ligation  Prenatal Labs:   Blood type/Rh O pos  Antibody screen neg  Rubella Immune  Varicella Immune  RPR NR  HBsAg Neg  HIV NR  GC neg  Chlamydia neg  Genetic screening MaterniT21 neg and AFP Pos  1 hour GTT Early 204  3 hour GTT    GBS neg     Plan:  Desiree Santos was discharged to home in good condition. Follow-up appointment with as directed.   Discharge Medications: Allergies as of 04/30/2023   No Known Allergies      Medication List     STOP taking these medications    insulin glargine 100 UNIT/ML injection Commonly known as: LANTUS   Lantus SoloStar 100 UNIT/ML Solostar Pen Generic drug: insulin glargine   ondansetron 4 MG tablet Commonly known as: ZOFRAN   ondansetron 8 MG tablet Commonly known as: ZOFRAN   Precision QID Test test strip Generic drug: glucose blood       TAKE these medications    acetaminophen 325 MG tablet Commonly known as: Tylenol Take 2 tablets (650 mg total) by mouth every 4 (four) hours as needed (for pain scale < 4).   aspirin 81 MG chewable tablet Chew 81 mg by mouth daily.   cyanocobalamin 1000 MCG/ML injection Commonly known as: VITAMIN B12 Inject 1,000 mcg into the muscle once a week.   folic acid 1 MG tablet Commonly known as: FOLVITE Take 1 mg by mouth daily.   ibuprofen 600 MG tablet Commonly known as: ADVIL Take 1 tablet (600 mg total) by mouth every 6 (six) hours as needed for mild pain or cramping.   labetalol 200 MG tablet Commonly known as: NORMODYNE Take 1  tablet by mouth 2 (two) times daily.   levothyroxine 75 MCG tablet Commonly known as: SYNTHROID Take 75 mcg by mouth at bedtime. What changed: Another medication with the same name was removed. Continue taking this medication, and follow the directions you see here.   levothyroxine 100 MCG tablet Commonly known as: SYNTHROID Take one 100 mcg tablet along with one 88 mcg tablet for total of 188 mcg once daily. Do not take with food or other medications for at least thirty minutes. What changed: Another medication with the same name was removed. Continue taking this medication, and follow the directions you see here.   omeprazole 40 MG capsule Commonly known as: PRILOSEC Take 40 mg by mouth daily.         Follow-up Information     Christeen Douglas, MD Follow up in 2 week(s).   Specialty: Obstetrics and Gynecology Why: BTL consult Contact information: [address] [place] [address] [phone]         Sonny Dandy, CNM Follow up in 6 week(s).   Specialty: Obstetrics Why: postpartum visit Contact information: [address] [place] [address] [phone]         [facility] Follow up on 05/04/2023.   Why: blood pressure check at 8:30 am Contact information: [address] [place] [address] [phone]                Signed:  Margaretmary Eddy, CNM Certified Nurse Midwife [facility]

## 2023-04-28 NOTE — Progress Notes (Signed)
L&D Note    Subjective:  has no unusual complaints  Objective:   Vitals:   04/28/23 0900 04/28/23 1133 04/28/23 1304 04/28/23 1500  BP: (!) 147/82 (!) 141/74 123/68 137/77  Pulse:  78 74 77  Resp:  14    Temp:  98.4 F (36.9 C)    TempSrc:  Oral    Weight:      Height:        Current Vital Signs 24h Vital Sign Ranges  T 98.4 F (36.9 C) Temp  Avg: 98.3 F (36.8 C)  Min: 97.9 F (36.6 C)  Max: 98.5 F (36.9 C)  BP 137/77 BP  Min: 123/68  Max: 156/87  HR 77 Pulse  Avg: 73.4  Min: 65  Max: 78  RR 14 Resp  Avg: 15.7  Min: 14  Max: 17  SaO2     No data recorded      Gen: alert, cooperative, no distress FHR: Baseline: 135 bpm, Variability: moderate, Accels: Present, Decels: none Toco: regular, every 2-4 minutes SVE: Dilation: 6 Effacement (%): 60 Station: -2, -1 Presentation: Vertex Exam by:: Rubye Oaks CNM  Medications SCHEDULED MEDICATIONS   ammonia       lidocaine (PF)       misoprostol       misoprostol  25 mcg Oral Q4H   And   misoprostol  25 mcg Vaginal Q4H   oxytocin       oxytocin 40 units in LR 1000 mL  333 mL Intravenous Once   Oxytocin-Sodium Chloride        MEDICATION INFUSIONS   lactated ringers     lactated ringers 125 mL/hr at 04/28/23 1325   oxytocin     oxytocin 12 milli-units/min (04/28/23 1600)    PRN MEDICATIONS  acetaminophen, ammonia, fentaNYL (SUBLIMAZE) injection, lactated ringers, lidocaine (PF), lidocaine (PF), misoprostol, ondansetron, oxyCODONE-acetaminophen, oxyCODONE-acetaminophen, oxytocin, Oxytocin-Sodium Chloride, sodium citrate-citric acid, terbutaline   Assessment & Plan:  37 y.o. G2P1001 at [redacted]w[redacted]d admitted for [facility] -Labor: Active phase labor. and Inadequate uterine activity - intensity or frequency. -Fetal Well-being: Category I -GBS: negative -Membranes ruptured, clear fluid -Anticipate vaginal delivery. and Intervention: increase Pitocin rate and change maternal position -Analgesia: low intervention birth options ,  position changes , birth ball, and unmedicated labor support options   [facility]

## 2023-04-28 NOTE — Progress Notes (Signed)
   04/28/23 1600  Spiritual Encounters  Type of Visit Initial  Care provided to: Pt and family  Referral source Chaplain assessment  Reason for visit Routine spiritual support  OnCall Visit No  Spiritual Framework  Presenting Themes Courage hope and growth  Interventions  Spiritual Care Interventions Made Established relationship of care and support;Compassionate presence;Encouragement  Intervention Outcomes  Outcomes Awareness of support  Spiritual Care Plan  Spiritual Care Issues Still Outstanding No further spiritual care needs at this time (see row info)   Chaplain provided compassionate care to patient and family.  Chaplain spiritual support services remain available as need arises.

## 2023-04-28 NOTE — H&P (Signed)
OB History & Physical   History of Present Illness:   Chief Complaint: IOL for CHTN  HPI:  Desiree Santos is a 37 y.o. G7P1001 female at [redacted]w[redacted]d, No LMP recorded (lmp unknown). Patient is pregnant.,  [place] at [redacted]w[redacted]d, with Estimated Date of Delivery: 05/17/23.  She presents to L&D for IOL for GHTN and GDM A2  Reports active fetal movement  Contractions:  irregular, patient denies feeling LOF/SROM: intact Vaginal bleeding: denies  Factors complicating pregnancy:  1. Positive AFP 2. CHTN 3. AMA 4. Obesity 5. Hypothyroidism 6. Early GDM 7. H/o retained placenta 8. Anemia  Patient Active Problem List   Diagnosis Date Noted   Chronic hypertension affecting pregnancy 04/28/2023   Decreased fetal movement 02/20/2023   [redacted] weeks gestation of pregnancy 10/28/2022   Supervision of high risk pregnancy in second trimester 09/25/2022   Primary hypertension 07/21/2022   Iron deficiency anemia 07/14/2022   Chronic migraine 02/05/2017   Nonintractable headache 02/03/2017   Morbid obesity with BMI of 40.0-44.9, adult (HCC) 11/05/2016   Hypothyroidism 09/24/2016   Gastroesophageal reflux disease without esophagitis 09/24/2016   HLD (hyperlipidemia) 09/24/2016    Prenatal Transfer Tool  Maternal Diabetes: Yes:  Diabetes Type:  Insulin/Medication controlled Genetic Screening: Abnormal:  Results: Elevated AFP Maternal Ultrasounds/Referrals: Normal Fetal Ultrasounds or other Referrals:  Referred to Materal Fetal Medicine  Maternal Substance Abuse:  No Significant Maternal Medications:  Meds include: Syntroid Significant Maternal Lab Results: Group B Strep negative  Maternal Medical History:   Past Medical History:  Diagnosis Date   Chicken pox    Elevated BP without diagnosis of hypertension    GERD (gastroesophageal reflux disease)    Gestational diabetes    Headache    History of kidney stones    History of UTI    Thyroid disease     Past Surgical History:  Procedure Laterality  Date   CHOLECYSTECTOMY     KIDNEY SURGERY  2002   h/o kidney reflux?   TONSILLECTOMY     WISDOM TOOTH EXTRACTION Right 10/20/2022    No Known Allergies  Prior to Admission medications   Medication Sig Start Date End Date Taking? Authorizing Provider  levothyroxine (SYNTHROID) 100 MCG tablet Take one 100 mcg tablet along with one 88 mcg tablet for total of 188 mcg once daily. Do not take with food or other medications for at least thirty minutes. 10/28/22  Yes Dugal, Wyatt Mage, FNP  levothyroxine (SYNTHROID) 75 MCG tablet Take 75 mcg by mouth at bedtime.   Yes [provider]  ondansetron (ZOFRAN) 8 MG tablet Take 8 mg by mouth every 8 (eight) hours as needed for nausea or vomiting.   Yes [provider]  aspirin 81 MG chewable tablet Chew 81 mg by mouth daily.    [provider]  insulin glargine (LANTUS) 100 UNIT/ML injection Inject 35 Units into the skin in the morning.    [provider]  insulin glargine (LANTUS) 100 UNIT/ML injection Inject 25 Units into the skin at bedtime.    [provider]  labetalol (NORMODYNE) 200 MG tablet Take 1 tablet by mouth 2 (two) times daily. Patient not taking: Reported on 12/12/2022 09/25/22 09/25/23  [provider]  levothyroxine (SYNTHROID) 88 MCG tablet Take one 88 mcg tablet along with one 100 mcg tablet for total of 188 mcg once daily. Do not take with food or other medications for at least thirty minutes. Patient not taking: Reported on 04/28/2023 10/28/22   Mort Sawyers, FNP  omeprazole (PRILOSEC) 40 MG capsule Take 40 mg by mouth daily. 11/05/22 11/05/23  [provider]  PRECISION QID TEST test strip 1 each (1 strip total) 4 (four) times daily Use as instructed. Please fill one covered by patient's insurance 12/05/22 12/05/23  [provider]     Prenatal care site:  [facility]  OB History  Gravida Para Term Preterm AB Living  2 1 1  0 0 1  SAB IAB Ectopic Multiple  Live Births  0 0 0 0 1    # Outcome Date GA Lbr Len/2nd Weight Sex Delivery Anes PTL Lv  2 Current           1 Term 04/16/13   2920 g M Vag-Spont  N LIV     Social History: She  reports that she has quit smoking. Her smoking use included cigarettes. She has never used smokeless tobacco. She reports that she does not drink alcohol and does not use drugs.  Family History: family history includes Arthritis in her maternal grandmother; Breast cancer in her paternal grandmother; Cancer in her maternal grandmother, paternal grandfather, and paternal grandmother; Dementia in her maternal grandmother; Depression in her maternal grandmother and paternal grandfather; Diabetes in her paternal grandfather; Early death in her paternal grandfather and paternal grandmother; Heart disease in her father and paternal grandfather; Hyperlipidemia in her mother and paternal grandfather; Hypertension in her maternal grandfather, maternal grandmother, mother, and paternal grandfather; Sudden Cardiac Death in her father.   Review of Systems: A full review of systems was performed and negative except as noted in the HPI.     Physical Exam:  Vital Signs: BP 128/77 (BP Location: Left Arm)   Pulse 65   Temp 97.9 F (36.6 C) (Oral)   Resp 16   Ht 5\' 2"  (1.575 m)   Wt 104.3 kg   LMP  (LMP Unknown)   BMI 42.07 kg/m   General: no acute distress.  HEENT: normocephalic, atraumatic Heart: regular rate & rhythm Lungs: normal respiratory effort Abdomen: soft, gravid, non-tender;  Pelvic:   External: Normal external female genitalia  Cervix: Dilation: 2.5 / Effacement (%): 60 / Station: -3    Extremities: non-tender, symmetric, LE edema bilaterally.  DTRs: +2  Neurologic: Alert & oriented x 3.    Results for orders placed or performed during the hospital encounter of 04/28/23 (from the past 24 hour(s))  CBC     Status: Abnormal   Collection Time: 04/28/23  5:57 AM  Result Value Ref Range   WBC 9.6 4.0 - 10.5  K/uL   RBC 4.23 3.87 - 5.11 MIL/uL   Hemoglobin 12.2 12.0 - 15.0 g/dL   HCT 16.1 (L) 09.6 - 04.5 %   MCV 82.7 80.0 - 100.0 fL   MCH 28.8 26.0 - 34.0 pg   MCHC 34.9 30.0 - 36.0 g/dL   RDW 40.9 (H) 81.1 - 91.4 %   Platelets 221 150 - 400 K/uL   nRBC 0.0 0.0 - 0.2 %  Type and screen     Status: None   Collection Time: 04/28/23  5:57 AM  Result Value Ref Range   ABO/RH(D) O POS    Antibody Screen NEG    Sample Expiration      05/01/2023,2359 Performed at [facility], [address] [place], [place] [address]   Glucose, capillary     Status: None   Collection Time: 04/28/23  6:10 AM  Result Value Ref Range   Glucose-Capillary 85 70 -  99 mg/dL  ABO/Rh     Status: None   Collection Time: 04/28/23  6:48 AM  Result Value Ref Range   ABO/RH(D)      O POS Performed at [facility], [address] [place], [place] [address]     Pertinent Results:  Prenatal Labs:  Blood type/Rh O pos  Antibody screen neg  Rubella Immune  Varicella Immune  RPR NR  HBsAg Neg  HIV NR  GC neg  Chlamydia neg  Genetic screening MaterniT21 neg and AFP Pos  1 hour GTT Early 204  3 hour GTT    GBS neg     FHT:  FHR: 135 bpm, variability: moderate,  accelerations:  Present,  decelerations:  Absent Category/reactivity:  Category I UC:   irregular   Cephalic by [place] and SVE   No results found.  Assessment:  Desiree Santos is a 37 y.o. G42P1001 female at [redacted]w[redacted]d with CHTN,GDMA2.   Plan:  1. Admit to Labor & Delivery - consents reviewed and obtained - Dr. Dalbert Garnet notified of admission and plan of care   2. Fetal Well being  - Fetal Tracing: category - Group B Streptococcus ppx not indicated: GBS negative - Presentation: cephalic confirmed by SVE   3. Routine OB: - Prenatal labs reviewed, as above - Rh positive - CBC, T&S, RPR on admit - Clear liquid diet , continuous IV fluids  4. Induction of labor  - Contractions monitored with external toco - Pelvis  proven to 2920g adequate for trial of labor  - Plan for induction with misoprostol, oxytocin, and AROM  - Augmentation with oxytocin and AROM as appropriate  - Plan for  continuous fetal monitoring - Maternal pain control as desired; planning unmedicated labor support options  - Anticipate vaginal delivery  5. Post Partum Planning: - Infant feeding: formula feeding - Contraception: bilateral tubal ligation - Tdap vaccine:  not given - Flu vaccine:  not in season   LUCY Delmar Landau, CNM 04/28/23 8:51 AM  Chari Manning, CNM Certified Nurse Midwife [facility]

## 2023-04-28 NOTE — Progress Notes (Signed)
L&D Note    Subjective:  has no unusual complaints  Objective:   Vitals:   04/28/23 0611 04/28/23 0709  BP: (!) 156/87 128/77  Pulse: 73 65  Resp: 17 16  Temp: 98.5 F (36.9 C) 97.9 F (36.6 C)  TempSrc: Oral Oral  Weight: 104.3 kg   Height: 5\' 2"  (1.575 m)     Current Vital Signs 24h Vital Sign Ranges  T 97.9 F (36.6 C) Temp  Avg: 98.2 F (36.8 C)  Min: 97.9 F (36.6 C)  Max: 98.5 F (36.9 C)  BP 128/77 BP  Min: 128/77  Max: 156/87  HR 65 Pulse  Avg: 69  Min: 65  Max: 73  RR 16 Resp  Avg: 16.5  Min: 16  Max: 17  SaO2     No data recorded      Gen: alert, cooperative, no distress FHR: Baseline: 145 bpm, Variability: moderate, Accels: Present, Decels: none Toco: none SVE: Dilation: 2.5 Effacement (%): 60 Station: -3 Exam by:: Rubye Oaks CNM  Medications SCHEDULED MEDICATIONS   ammonia       lidocaine (PF)       misoprostol       misoprostol  25 mcg Oral Q4H   And   misoprostol  25 mcg Vaginal Q4H   oxytocin       oxytocin 40 units in LR 1000 mL  333 mL Intravenous Once   Oxytocin-Sodium Chloride        MEDICATION INFUSIONS   lactated ringers     lactated ringers 125 mL/hr at 04/28/23 0708   oxytocin     oxytocin      PRN MEDICATIONS  acetaminophen, ammonia, fentaNYL (SUBLIMAZE) injection, lactated ringers, lidocaine (PF), lidocaine (PF), misoprostol, ondansetron, oxyCODONE-acetaminophen, oxyCODONE-acetaminophen, oxytocin, Oxytocin-Sodium Chloride, sodium citrate-citric acid, terbutaline   Assessment & Plan:  37 y.o. G2P1001 at [redacted]w[redacted]d admitted for [facility], GDMA2 -Labor: Not in labor. -Fetal Well-being: Category I -GBS: negative -Membranes intact -Anticipate vaginal delivery. and Intervention: place a Cook's Cath cervical balloon and give oral Cytotec. -Analgesia: low intervention birth options , position changes , and unmedicated labor support options    [facility]

## 2023-04-29 ENCOUNTER — Encounter: Payer: Self-pay | Admitting: Obstetrics and Gynecology

## 2023-04-29 LAB — CBC
HCT: 30.9 % — ABNORMAL LOW (ref 36.0–46.0)
Hemoglobin: 10.5 g/dL — ABNORMAL LOW (ref 12.0–15.0)
MCH: 28.5 pg (ref 26.0–34.0)
MCHC: 34 g/dL (ref 30.0–36.0)
MCV: 84 fL (ref 80.0–100.0)
Platelets: 241 10*3/uL (ref 150–400)
RBC: 3.68 MIL/uL — ABNORMAL LOW (ref 3.87–5.11)
RDW: 15.6 % — ABNORMAL HIGH (ref 11.5–15.5)
WBC: 14.6 10*3/uL — ABNORMAL HIGH (ref 4.0–10.5)
nRBC: 0 % (ref 0.0–0.2)

## 2023-04-30 MED ORDER — IBUPROFEN 600 MG PO TABS: 600.0000 mg | ORAL_TABLET | Freq: Four times a day (QID) | ORAL | 0 refills | Status: DC | PRN

## 2023-04-30 MED ORDER — ACETAMINOPHEN 325 MG PO TABS: 650.0000 mg | ORAL_TABLET | ORAL | Status: DC | PRN

## 2023-04-30 NOTE — Discharge Instructions (Signed)
For blood pressure monitoring at home: Please check your blood pressure at home daily and write down what it is so that you can review it with your provider  Make sure you are able to sit for 15 minutes prior to taking it  If blood pressure 160/110 or greater repeat in 15 minutes.  If still 160/110 or greater come to the Emergency Department for evaluation.  If it goes down please contact the on call provider to check in.   Please go to the ED for severe range blood pressures (160/110 or higher), moderate to severe headache that is not relieved with Tylenol, chest pain, shortness of breath, changes in vision, or persistent pain in upper right abdomen.    Vaginal Delivery, Care After Refer to this sheet in the next few weeks. These discharge instructions provide you with information on caring for yourself after delivery. Your caregiver may also give you specific instructions. Your treatment has been planned according to the most current medical practices available, but problems sometimes occur. Call your caregiver if you have any problems or questions after you go home. HOME CARE INSTRUCTIONS Take over-the-counter or prescription medicines only as directed by your caregiver or pharmacist. Do not drink alcohol, especially if you are breastfeeding or taking medicine to relieve pain. Do not smoke tobacco. Continue to use good perineal care. Good perineal care includes: Wiping your perineum from back to front Keeping your perineum clean. You can do sitz baths twice a day, to help keep this area clean Do not use tampons, douche or have sex until your caregiver says it is okay. Shower only and avoid sitting in submerged water, aside from sitz baths Wear a well-fitting bra that provides breast support. Eat healthy foods. Drink enough fluids to keep your urine clear or pale yellow. Eat high-fiber foods such as whole grain cereals and breads, brown rice, beans, and fresh fruits and vegetables every day.  These foods may help prevent or relieve constipation. Avoid constipation with high fiber foods or medications, such as miralax or metamucil Follow your caregiver's recommendations regarding resumption of activities such as climbing stairs, driving, lifting, exercising, or traveling. Talk to your caregiver about resuming sexual activities. Resumption of sexual activities is dependent upon your risk of infection, your rate of healing, and your comfort and desire to resume sexual activity. Try to have someone help you with your household activities and your newborn for at least a few days after you leave the hospital. Rest as much as possible. Try to rest or take a nap when your newborn is sleeping. Increase your activities gradually. Keep all of your scheduled postpartum appointments. It is very important to keep your scheduled follow-up appointments. At these appointments, your caregiver will be checking to make sure that you are healing physically and emotionally. SEEK MEDICAL CARE IF:  You are passing large clots from your vagina. Save any clots to show your caregiver. You have a foul smelling discharge from your vagina. You have trouble urinating. You are urinating frequently. You have pain when you urinate. You have a change in your bowel movements. You have increasing redness, pain, or swelling near your vaginal incision (episiotomy) or vaginal tear. You have pus draining from your episiotomy or vaginal tear. Your episiotomy or vaginal tear is separating. You have painful, hard, or reddened breasts. You have a severe headache. You have blurred vision or see spots. You feel sad or depressed. You have thoughts of hurting yourself or your newborn. You have questions about  your care, the care of your newborn, or medicines. You are dizzy or light-headed. You have a rash. You have nausea or vomiting. You were breastfeeding and have not had a menstrual period within 12 weeks after you stopped  breastfeeding. You are not breastfeeding and have not had a menstrual period by the 12th week after delivery. You have a fever. SEEK IMMEDIATE MEDICAL CARE IF:  You have persistent pain. You have chest pain. You have shortness of breath. You faint. You have leg pain. You have stomach pain. Your vaginal bleeding saturates two or more sanitary pads in 1 hour. MAKE SURE YOU:  Understand these instructions. Will watch your condition. Will get help right away if you are not doing well or get worse. Document Released: 10/10/2000 Document Revised: 02/27/2014 Document Reviewed: 06/09/2012 [facility] Patient Information 2015 [place], [place]. This information is not intended to replace advice given to you by your health care provider. Make sure you discuss any questions you have with your health care provider.  Sitz Bath A sitz bath is a warm water bath taken in the sitting position. The water covers only the hips and butt (buttocks). We recommend using one that fits in the toilet, to help with ease of use and cleanliness. It may be used for either healing or cleaning purposes. Sitz baths are also used to relieve pain, itching, or muscle tightening (spasms). The water may contain medicine. Moist heat will help you heal and relax.  HOME CARE  Take 3 to 4 sitz baths a day. Fill the bathtub half-full with warm water. Sit in the water and open the drain a little. Turn on the warm water to keep the tub half-full. Keep the water running constantly. Soak in the water for 15 to 20 minutes. After the sitz bath, pat the affected area dry. GET HELP RIGHT AWAY IF: You get worse instead of better. Stop the sitz baths if you get worse. MAKE SURE YOU: Understand these instructions. Will watch your condition. Will get help right away if you are not doing well or get worse. Document Released: 11/20/2004 Document Revised: 07/07/2012 Document Reviewed: 02/10/2011 [facility] Patient Information 2015 [place], [place].  This information is not intended to replace advice given to you by your health care provider. Make sure you discuss any questions you have with your health care provider.

## 2023-05-04 ENCOUNTER — Other Ambulatory Visit: Payer: Self-pay | Admitting: Certified Nurse Midwife

## 2023-05-05 ENCOUNTER — Telehealth: Payer: Self-pay

## 2023-05-05 ENCOUNTER — Ambulatory Visit
Admission: RE | Admit: 2023-05-05 | Discharge: 2023-05-05 | Disposition: A | Discharge status: Home / Self Care | Payer: BC Managed Care – PPO | Source: Ambulatory Visit | Attending: Certified Nurse Midwife | Admitting: Certified Nurse Midwife

## 2023-05-05 NOTE — Telephone Encounter (Signed)

## 2023-05-06 ENCOUNTER — Ambulatory Visit: Payer: BC Managed Care – PPO | Admitting: Certified Registered Nurse Anesthetist

## 2023-05-06 ENCOUNTER — Encounter
Admission: RE | Disposition: A | Discharge status: Home / Self Care | Payer: Self-pay | Source: Home / Self Care | Attending: Obstetrics and Gynecology

## 2023-05-06 ENCOUNTER — Other Ambulatory Visit: Payer: Self-pay

## 2023-05-06 ENCOUNTER — Ambulatory Visit
Admission: RE | Admit: 2023-05-06 | Discharge: 2023-05-06 | Disposition: A | Discharge status: Home / Self Care | Payer: BC Managed Care – PPO | Attending: Obstetrics and Gynecology | Admitting: Obstetrics and Gynecology

## 2023-05-06 DIAGNOSIS — Z87440 Personal history of urinary (tract) infections: Secondary | ICD-10-CM | POA: Insufficient documentation

## 2023-05-06 DIAGNOSIS — K219 Gastro-esophageal reflux disease without esophagitis: Secondary | ICD-10-CM | POA: Diagnosis not present

## 2023-05-06 DIAGNOSIS — Z87891 Personal history of nicotine dependence: Secondary | ICD-10-CM | POA: Insufficient documentation

## 2023-05-06 DIAGNOSIS — E039 Hypothyroidism, unspecified: Secondary | ICD-10-CM | POA: Diagnosis not present

## 2023-05-06 DIAGNOSIS — Z833 Family history of diabetes mellitus: Secondary | ICD-10-CM | POA: Insufficient documentation

## 2023-05-06 DIAGNOSIS — Z87442 Personal history of urinary calculi: Secondary | ICD-10-CM | POA: Insufficient documentation

## 2023-05-06 DIAGNOSIS — Z6841 Body Mass Index (BMI) 40.0 and over, adult: Secondary | ICD-10-CM | POA: Insufficient documentation

## 2023-05-06 DIAGNOSIS — D509 Iron deficiency anemia, unspecified: Secondary | ICD-10-CM

## 2023-05-06 DIAGNOSIS — R030 Elevated blood-pressure reading, without diagnosis of hypertension: Secondary | ICD-10-CM | POA: Insufficient documentation

## 2023-05-06 DIAGNOSIS — N9981 Other intraoperative complications of genitourinary system: Secondary | ICD-10-CM | POA: Insufficient documentation

## 2023-05-06 DIAGNOSIS — I10 Essential (primary) hypertension: Secondary | ICD-10-CM | POA: Diagnosis not present

## 2023-05-06 DIAGNOSIS — Z8632 Personal history of gestational diabetes: Secondary | ICD-10-CM | POA: Diagnosis not present

## 2023-05-06 HISTORY — PX: DILATION AND CURETTAGE OF UTERUS: SHX78

## 2023-05-06 LAB — URINALYSIS, ROUTINE W REFLEX MICROSCOPIC
Bilirubin Urine: NEGATIVE
Glucose, UA: NEGATIVE mg/dL
Hgb urine dipstick: NEGATIVE
Ketones, ur: NEGATIVE mg/dL
Leukocytes,Ua: NEGATIVE
Nitrite: NEGATIVE
Protein, ur: NEGATIVE mg/dL
Specific Gravity, Urine: 1.015 (ref 1.005–1.030)
pH: 6 (ref 5.0–8.0)

## 2023-05-06 LAB — TYPE AND SCREEN
ABO/RH(D): O POS
Antibody Screen: NEGATIVE

## 2023-05-06 LAB — CBC
HCT: 30.5 % — ABNORMAL LOW (ref 36.0–46.0)
Hemoglobin: 10.3 g/dL — ABNORMAL LOW (ref 12.0–15.0)
MCH: 28.9 pg (ref 26.0–34.0)
MCHC: 33.8 g/dL (ref 30.0–36.0)
MCV: 85.7 fL (ref 80.0–100.0)
Platelets: 308 10*3/uL (ref 150–400)
RBC: 3.56 MIL/uL — ABNORMAL LOW (ref 3.87–5.11)
RDW: 15.7 % — ABNORMAL HIGH (ref 11.5–15.5)
WBC: 9.6 10*3/uL (ref 4.0–10.5)
nRBC: 0 % (ref 0.0–0.2)

## 2023-05-06 LAB — HEMOGLOBIN AND HEMATOCRIT, BLOOD
HCT: 30.5 % — ABNORMAL LOW (ref 36.0–46.0)
Hemoglobin: 10.1 g/dL — ABNORMAL LOW (ref 12.0–15.0)

## 2023-05-06 LAB — POCT PREGNANCY, URINE: Preg Test, Ur: NEGATIVE

## 2023-05-06 SURGERY — DILATION AND CURETTAGE
Anesthesia: General

## 2023-05-06 MED ORDER — OXYCODONE HCL 5 MG/5ML PO SOLN: 5.0000 mg | Freq: Once | ORAL | Status: AC | PRN

## 2023-05-06 MED ORDER — SUCCINYLCHOLINE CHLORIDE 200 MG/10ML IV SOSY
PREFILLED_SYRINGE | INTRAVENOUS | Status: AC
  Filled 2023-05-06: qty 10

## 2023-05-06 MED ORDER — ACETAMINOPHEN EXTRA STRENGTH 500 MG PO TABS: 1000.0000 mg | ORAL_TABLET | Freq: Four times a day (QID) | ORAL | 0 refills | Status: AC

## 2023-05-06 MED ORDER — DOXYCYCLINE HYCLATE 100 MG PO TABS
200.0000 mg | ORAL_TABLET | Freq: Once | ORAL | Status: DC
  Filled 2023-05-06: qty 2

## 2023-05-06 MED ORDER — VASOPRESSIN 20 UNIT/ML IV SOLN
INTRAVENOUS | Status: DC | PRN
  Administered 2023-05-06: 10 mL via INTRAMUSCULAR

## 2023-05-06 MED ORDER — VASOPRESSIN 20 UNIT/ML IV SOLN
INTRAVENOUS | Status: AC
  Filled 2023-05-06: qty 1

## 2023-05-06 MED ORDER — TRANEXAMIC ACID 1000 MG/10ML IV SOLN
INTRAVENOUS | Status: AC
  Filled 2023-05-06: qty 10

## 2023-05-06 MED ORDER — IBUPROFEN 800 MG PO TABS: 800.0000 mg | ORAL_TABLET | Freq: Three times a day (TID) | ORAL | 1 refills | Status: AC

## 2023-05-06 MED ORDER — LACTATED RINGERS IV SOLN: INTRAVENOUS | Status: DC

## 2023-05-06 MED ORDER — AMOXICILLIN-POT CLAVULANATE 875-125 MG PO TABS: 1.0000 | ORAL_TABLET | Freq: Two times a day (BID) | ORAL | 1 refills | Status: DC

## 2023-05-06 MED ORDER — MISOPROSTOL 100 MCG PO TABS
ORAL_TABLET | ORAL | Status: DC | PRN
  Administered 2023-05-06: 800 ug via RECTAL

## 2023-05-06 MED ORDER — OXYCODONE HCL 5 MG PO TABS
5.0000 mg | ORAL_TABLET | Freq: Once | ORAL | Status: AC | PRN
  Administered 2023-05-06: 5 mg via ORAL

## 2023-05-06 MED ORDER — 0.9 % SODIUM CHLORIDE (POUR BTL) OPTIME
TOPICAL | Status: DC | PRN
  Administered 2023-05-06: 500 mL

## 2023-05-06 MED ORDER — DEXMEDETOMIDINE HCL IN NACL 80 MCG/20ML IV SOLN
INTRAVENOUS | Status: DC | PRN
  Administered 2023-05-06: 12 ug via INTRAVENOUS

## 2023-05-06 MED ORDER — TRANEXAMIC ACID-NACL 1000-0.7 MG/100ML-% IV SOLN
INTRAVENOUS | Status: DC | PRN
  Administered 2023-05-06: 1000 mg via INTRAVENOUS

## 2023-05-06 MED ORDER — MISOPROSTOL 200 MCG PO TABS
ORAL_TABLET | ORAL | Status: AC
  Filled 2023-05-06: qty 4

## 2023-05-06 MED ORDER — FENTANYL CITRATE (PF) 100 MCG/2ML IJ SOLN
25.0000 ug | INTRAMUSCULAR | Status: DC | PRN
  Administered 2023-05-06 (×2): 50 ug via INTRAVENOUS

## 2023-05-06 MED ORDER — LABETALOL HCL 5 MG/ML IV SOLN
5.0000 mg | INTRAVENOUS | Status: AC | PRN
  Administered 2023-05-06 (×3): 5 mg via INTRAVENOUS

## 2023-05-06 MED ORDER — LIDOCAINE HCL (PF) 2 % IJ SOLN
INTRAMUSCULAR | Status: AC
  Filled 2023-05-06: qty 5

## 2023-05-06 MED ORDER — CHLORHEXIDINE GLUCONATE 0.12 % MT SOLN
OROMUCOSAL | Status: AC
  Filled 2023-05-06: qty 15

## 2023-05-06 MED ORDER — OXYCODONE HCL 5 MG PO TABS
ORAL_TABLET | ORAL | Status: AC
  Filled 2023-05-06: qty 1

## 2023-05-06 MED ORDER — LIDOCAINE HCL (CARDIAC) PF 100 MG/5ML IV SOSY
PREFILLED_SYRINGE | INTRAVENOUS | Status: DC | PRN
  Administered 2023-05-06: 100 mg via INTRAVENOUS

## 2023-05-06 MED ORDER — ONDANSETRON HCL 4 MG/2ML IJ SOLN
INTRAMUSCULAR | Status: AC
  Filled 2023-05-06: qty 2

## 2023-05-06 MED ORDER — POVIDONE-IODINE 10 % EX SWAB
2.0000 | Freq: Once | CUTANEOUS | Status: AC
  Administered 2023-05-06: 2 via TOPICAL

## 2023-05-06 MED ORDER — FAMOTIDINE 20 MG PO TABS
20.0000 mg | ORAL_TABLET | Freq: Once | ORAL | Status: AC
  Administered 2023-05-06: 20 mg via ORAL

## 2023-05-06 MED ORDER — FENTANYL CITRATE (PF) 100 MCG/2ML IJ SOLN
INTRAMUSCULAR | Status: AC
  Filled 2023-05-06: qty 2

## 2023-05-06 MED ORDER — METHYLERGONOVINE MALEATE 0.2 MG/ML IJ SOLN
INTRAMUSCULAR | Status: DC | PRN
  Administered 2023-05-06: .2 mg via INTRAMUSCULAR

## 2023-05-06 MED ORDER — ORAL CARE MOUTH RINSE: 15.0000 mL | Freq: Once | OROMUCOSAL | Status: AC

## 2023-05-06 MED ORDER — ONDANSETRON HCL 4 MG/2ML IJ SOLN
INTRAMUSCULAR | Status: DC | PRN
  Administered 2023-05-06: 4 mg via INTRAVENOUS

## 2023-05-06 MED ORDER — CEFAZOLIN SODIUM-DEXTROSE 2-3 GM-%(50ML) IV SOLR
INTRAVENOUS | Status: DC | PRN
  Administered 2023-05-06: 2 g via INTRAVENOUS

## 2023-05-06 MED ORDER — DEXAMETHASONE SODIUM PHOSPHATE 10 MG/ML IJ SOLN
INTRAMUSCULAR | Status: DC | PRN
  Administered 2023-05-06: 10 mg via INTRAVENOUS

## 2023-05-06 MED ORDER — ONDANSETRON 4 MG PO TBDP: 4.0000 mg | ORAL_TABLET | Freq: Three times a day (TID) | ORAL | 0 refills | Status: DC | PRN

## 2023-05-06 MED ORDER — ACETAMINOPHEN 10 MG/ML IV SOLN
INTRAVENOUS | Status: AC
  Filled 2023-05-06: qty 100

## 2023-05-06 MED ORDER — METHYLERGONOVINE MALEATE 0.2 MG/ML IJ SOLN
INTRAMUSCULAR | Status: AC
  Filled 2023-05-06: qty 1

## 2023-05-06 MED ORDER — PROPOFOL 10 MG/ML IV BOLUS
INTRAVENOUS | Status: DC | PRN
  Administered 2023-05-06: 200 mg via INTRAVENOUS

## 2023-05-06 MED ORDER — CHLORHEXIDINE GLUCONATE 0.12 % MT SOLN
15.0000 mL | Freq: Once | OROMUCOSAL | Status: AC
  Administered 2023-05-06: 15 mL via OROMUCOSAL

## 2023-05-06 MED ORDER — FAMOTIDINE 20 MG PO TABS
ORAL_TABLET | ORAL | Status: AC
  Filled 2023-05-06: qty 1

## 2023-05-06 MED ORDER — DOXYCYCLINE HYCLATE 100 MG PO TABS
100.0000 mg | ORAL_TABLET | Freq: Once | ORAL | Status: AC
  Administered 2023-05-06: 100 mg via ORAL
  Filled 2023-05-06: qty 1

## 2023-05-06 MED ORDER — SUCCINYLCHOLINE CHLORIDE 200 MG/10ML IV SOSY
PREFILLED_SYRINGE | INTRAVENOUS | Status: DC | PRN
  Administered 2023-05-06: 100 mg via INTRAVENOUS

## 2023-05-06 MED ORDER — LABETALOL HCL 5 MG/ML IV SOLN
INTRAVENOUS | Status: AC
  Filled 2023-05-06: qty 4

## 2023-05-06 MED ORDER — ACETAMINOPHEN 10 MG/ML IV SOLN
INTRAVENOUS | Status: DC | PRN
  Administered 2023-05-06: 1000 mg via INTRAVENOUS

## 2023-05-06 MED ORDER — DEXAMETHASONE SODIUM PHOSPHATE 10 MG/ML IJ SOLN
INTRAMUSCULAR | Status: AC
  Filled 2023-05-06: qty 1

## 2023-05-06 MED ORDER — SODIUM CHLORIDE FLUSH 0.9 % IV SOLN
INTRAVENOUS | Status: AC
  Filled 2023-05-06: qty 20

## 2023-05-06 MED ORDER — PROPOFOL 10 MG/ML IV BOLUS
INTRAVENOUS | Status: AC
  Filled 2023-05-06: qty 20

## 2023-05-06 MED ORDER — FENTANYL CITRATE (PF) 100 MCG/2ML IJ SOLN
INTRAMUSCULAR | Status: DC | PRN
  Administered 2023-05-06: 50 ug via INTRAVENOUS
  Administered 2023-05-06 (×2): 25 ug via INTRAVENOUS

## 2023-05-06 MED ORDER — GLYCOPYRROLATE 0.2 MG/ML IJ SOLN
INTRAMUSCULAR | Status: DC | PRN
  Administered 2023-05-06: .2 mg via INTRAVENOUS

## 2023-05-06 SURGICAL SUPPLY — 24 items
CNTNR URN SCR LID CUP LEK RST (MISCELLANEOUS) IMPLANT
CONT SPEC 4OZ STRL OR WHT (MISCELLANEOUS) ×2
DEVICE JADA (OBSTETRICS) IMPLANT
DRSG TELFA 3X8 NADH STRL (GAUZE/BANDAGES/DRESSINGS) IMPLANT
GAUZE 4X4 16PLY ~~LOC~~+RFID DBL (SPONGE) IMPLANT
GLOVE BIO SURGEON STRL SZ7 (GLOVE) ×1 IMPLANT
GLOVE INDICATOR 7.5 STRL GRN (GLOVE) ×1 IMPLANT
GOWN STRL REUS W/ TWL LRG LVL3 (GOWN DISPOSABLE) ×2 IMPLANT
GOWN STRL REUS W/TWL LRG LVL3 (GOWN DISPOSABLE) ×2
KIT TURNOVER CYSTO (KITS) ×1 IMPLANT
MANIFOLD NEPTUNE II (INSTRUMENTS) ×1 IMPLANT
NDL HYPO 22X1.5 SAFETY MO (MISCELLANEOUS) IMPLANT
NEEDLE HYPO 22X1.5 SAFETY MO (MISCELLANEOUS) ×1 IMPLANT
PACK DNC HYST (MISCELLANEOUS) ×1 IMPLANT
PAD OB MATERNITY 4.3X12.25 (PERSONAL CARE ITEMS) ×1 IMPLANT
PAD PREP OB/GYN DISP 24X41 (PERSONAL CARE ITEMS) ×1 IMPLANT
SCRUB CHG 4% DYNA-HEX 4OZ (MISCELLANEOUS) ×1 IMPLANT
SET BERKELEY SUCTION TUBING (SUCTIONS) IMPLANT
SET CYSTO W/LG BORE CLAMP LF (SET/KITS/TRAYS/PACK) IMPLANT
SOL PREP PVP 2OZ (MISCELLANEOUS) ×1
SOLUTION PREP PVP 2OZ (MISCELLANEOUS) ×1 IMPLANT
TRAP FLUID SMOKE EVACUATOR (MISCELLANEOUS) ×1 IMPLANT
VACURETTE 12 RIGID CVD (CANNULA) IMPLANT
WATER STERILE IRR 500ML POUR (IV SOLUTION) ×1 IMPLANT

## 2023-05-06 NOTE — Op Note (Signed)
Operative Report Suction Dilation and Curettage   Indications: Retained placenta, postpartum bleeding, sp NSVD on 7/2  Pre-operative Diagnosis:  Suspected retained products of conception   Post-operative Diagnosis: same + hemorrhage  Procedure: 1. Suction D&C 2. Placement of a Jada intrauterine device 3. Urgent management of acute hemorrhage  Surgeon: Christeen Douglas, MD  Assistant(s):  None  Anesthesia: General LMA anesthesia  Anesthesiologist: Stephanie Coup, MD Anesthesiologist: Stephanie Coup, MD CRNA: Ginger Carne, CRNA  Estimated Blood Loss:           Intraoperative medications: 800 mcg rectal misoprostol; 0.2mg  IM methergine; 1000mg  TXA IV, Cervical block with 20U vasopressin; iv tylenol         Total IV Fluids:  Urine Output: 50ml         Specimens: endometrial curettings         Complications:  intra-op bleeding, requiring above medical management and placement of an intrauterine Jada to ,         Disposition: PACU - guarded condition.         Condition: stable  Findings: Uterus measuring 13 weeks; normal cervix, vagina, perineum.   Indication for procedure/Consents: 37 y.o. F here for scheduled surgery for treatment of postpartum bleeding with suspected retained products of conception.  She did have a retained placenta with manual removal at the time of delivery 8 days ago. Blood loss anemia, with a preop hgb of 10.3 and a pre-birth hgb of 13.2.  Risks of surgery were discussed with the patient including but not limited to: bleeding which may require transfusion; infection which may require antibiotics; injury to uterus or surrounding organs; intrauterine scarring which may impair future fertility; need for additional procedures including laparotomy or laparoscopy; and other postoperative/anesthesia complications. Written informed consent was obtained.    Procedure Details:   The patient received oral antibiotics while in the  preoperative area.  She was then taken to the operating room where general anesthesia was administered and was found to be adequate.  After a formal and adequate timeout was performed, she was placed in the dorsal lithotomy position and examined with the above findings. She was then prepped and draped in the sterile manner.   Her bladder was catheterized for an estimated amount of clear, yellow urine. A speculum was then placed in the patient's vagina and a single tooth tenaculum was applied to the anterior lip of the cervix.    No uterine sounding was performed on this pregnant uterus. Her cervix was serially dilated to accommodate a 12 sized flexible suction curette.  Her bleeding was significant and a large amount of products were removed.   A gentle sharp curettage was then performed until there was a gritty texture in all four quadrants.     After bimanual was unsuccessful in stopping her bleeding, we activated the medical interventions for postpartum hemorrhage.  She received 0.2 mg IM Methergine and 1000 mg of IV TXA.  I placed 800 mcg of rectal misoprostol, and instilled vasopressin into the cervical stroma.  Bimanual pressure was held for about 4 minutes with excellent control.  However, as I was removing the tenaculum and the speculum, bleeding returned briskly.  We then determined to place the [facility] which was attached to wall suction.  She had received 100 mg of oral Doxy in the preop area, and at this point we gave her 2 g of Ancef IV.  The tenaculum was removed from the anterior lip of the cervix and the vaginal  speculum was removed.The patient tolerated the procedure well and was taken to the recovery area awake, extubated and in stable condition.  We will continue to monitor her however, and currently the [facility]s is not expelling more blood.  I will monitor for 1 hour after her bleeding stabilizes, and will release the suction for half an hour before removing it entirely.  Prior to  leaving the operating room, we emptied her bladder all the way.  The patient will be discharged to home as per PACU criteria and if she is still bleeding, I will keep her for overnight monitoring or perform other interventions as necessary.  I will send her home on Augmentin for 7 days for prophylaxis.

## 2023-05-06 NOTE — Transfer of Care (Signed)
Immediate Anesthesia Transfer of Care Note  Patient: Desiree Santos  Procedure(s) Performed: SUCTION DILATATION AND CURETTAGE  Patient Location: PACU  Anesthesia Type:General  Level of Consciousness: awake and drowsy  Airway & Oxygen Therapy: Patient Spontanous Breathing and Patient connected to face mask oxygen  Post-op Assessment: Report given to RN and Post -op Vital signs reviewed and stable  Post vital signs: Reviewed and stable  Last Vitals:  Vitals Value Taken Time  BP 141/100 05/06/23 1326  Temp    Pulse 77 05/06/23 1326  Resp 16 05/06/23 1326  SpO2 100 % 05/06/23 1326  Vitals shown include unvalidated device data.  Last Pain:  Vitals:   05/06/23 1026  TempSrc: Oral         Complications: No notable events documented.

## 2023-05-06 NOTE — H&P (Addendum)
History and Physical   SERVICE: Gynecology   Patient Name: Desiree Santos Patient MRN:   [id number]  CC: Postpartum bleeding, retained products  HPI: Desiree Santos is a 37 y.o. G2P2002 1 week post NSVD complicated by manual removal of placenta and postpartum hemorrhage, with retained products, stable +bleeding. U/S with thickened stripe.   Review of Systems: positives in bold GEN:   fevers, chills, weight changes, appetite changes, fatigue, night sweats HEENT:  HA, vision changes, hearing loss, congestion, rhinorrhea, sinus pressure, dysphagia CV:   CP, palpitations PULM:  SOB, cough GI:  abd pain, N/V/D/C GU:  dysuria, urgency, frequency MSK:  arthralgias, myalgias, back pain, swelling SKIN:  rashes, color changes, pallor NEURO:  numbness, weakness, tingling, seizures, dizziness, tremors PSYCH:  depression, anxiety, behavioral problems, confusion  HEME/LYMPH:  easy bruising or bleeding ENDO:  heat/cold intolerance  Past Obstetrical History: OB History     Gravida  2   Para  2   Term  2   Preterm      AB      Living  2      SAB      IAB      Ectopic      Multiple  0   Live Births  2           Past Gynecologic History: No LMP recorded (lmp unknown).   Past Medical History: Past Medical History:  Diagnosis Date   Chicken pox    Elevated BP without diagnosis of hypertension    GERD (gastroesophageal reflux disease)    Gestational diabetes    Headache    History of kidney stones    History of UTI    Thyroid disease     Past Surgical History:   Past Surgical History:  Procedure Laterality Date   CHOLECYSTECTOMY     KIDNEY SURGERY  2002   h/o kidney reflux?   TONSILLECTOMY     WISDOM TOOTH EXTRACTION Right 10/20/2022    Family History:  family history includes Arthritis in her maternal grandmother; Breast cancer in her paternal grandmother; Cancer in her maternal grandmother, paternal grandfather, and paternal grandmother; Dementia  in her maternal grandmother; Depression in her maternal grandmother and paternal grandfather; Diabetes in her paternal grandfather; Early death in her paternal grandfather and paternal grandmother; Heart disease in her father and paternal grandfather; Hyperlipidemia in her mother and paternal grandfather; Hypertension in her maternal grandfather, maternal grandmother, mother, and paternal grandfather; Sudden Cardiac Death in her father.  Social History:  Social History   Socioeconomic History   Marital status: Significant Other    Spouse name: Dossie Der   Number of children: 1   Years of education: HS   Highest education level: Not on file  Occupational History    Employer: [employer]    Comment: Geologist, engineering  Tobacco Use   Smoking status: Former    Years: 2    Types: Cigarettes   Smokeless tobacco: Never  Building services engineer Use: Never used  Substance and Sexual Activity   Alcohol use: No   Drug use: No   Sexual activity: Yes    Partners: Male    Birth control/protection: Abstinence    Comment: doesn't like to have sex  Other Topics Concern   Not on file  Social History Narrative   Not on file   Social Determinants of Health   Financial Resource Strain: Not on file  Food Insecurity: No Food Insecurity (04/28/2023)  Hunger Vital Sign    Worried About Running Out of Food in the Last Year: Never true    Ran Out of Food in the Last Year: Never true  Transportation Needs: No Transportation Needs (04/28/2023)   PRAPARE - Administrator, Civil Service (Medical): No    Lack of Transportation (Non-Medical): No  Physical Activity: Not on file  Stress: Not on file  Social Connections: Not on file  Intimate Partner Violence: Not At Risk (04/28/2023)   Humiliation, Afraid, Rape, and Kick questionnaire    Fear of Current or Ex-Partner: No    Emotionally Abused: No    Physically Abused: No    Sexually Abused: No    Home Medications:  Medications  reconciled in EPIC  No current facility-administered medications on file prior to encounter.   Current Outpatient Medications on File Prior to Encounter  Medication Sig Dispense Refill   acetaminophen (TYLENOL) 325 MG tablet Take 2 tablets (650 mg total) by mouth every 4 (four) hours as needed (for pain scale < 4).     aspirin 81 MG chewable tablet Chew 81 mg by mouth daily.     cyanocobalamin (VITAMIN B12) 1000 MCG/ML injection Inject 1,000 mcg into the muscle once a week.     folic acid (FOLVITE) 1 MG tablet Take 1 mg by mouth daily.     ibuprofen (ADVIL) 600 MG tablet Take 1 tablet (600 mg total) by mouth every 6 (six) hours as needed for mild pain or cramping. 30 tablet 0   labetalol (NORMODYNE) 200 MG tablet Take 1 tablet by mouth 2 (two) times daily. (Patient not taking: Reported on 12/12/2022)     levothyroxine (SYNTHROID) 100 MCG tablet Take one 100 mcg tablet along with one 88 mcg tablet for total of 188 mcg once daily. Do not take with food or other medications for at least thirty minutes. 30 tablet 1   levothyroxine (SYNTHROID) 75 MCG tablet Take 75 mcg by mouth at bedtime.     omeprazole (PRILOSEC) 40 MG capsule Take 40 mg by mouth daily.      Allergies:  No Known Allergies  Physical Exam:      General Appearance:  Well developed, well nourished, no acute distress, alert and oriented x3 HEENT:  Normocephalic atraumatic, extraocular movements intact, moist mucous membranes Cardiovascular:  Normal S1/S2, regular rate and rhythm, no murmurs Pulmonary:  clear to auscultation, no wheezes, rales or rhonchi, symmetric air entry, good air exchange Abdomen:  Bowel sounds present, soft, nontender, nondistended, no abnormal masses, no epigastric pain Extremities:  Full range of motion, no pedal edema, 2+ distal pulses, no tenderness Skin:  normal coloration and turgor, no rashes, no suspicious skin lesions noted  Neurologic:  Cranial nerves 2-12 grossly intact, normal muscle tone,  strength 5/5 all four extremities Psychiatric:  Normal mood and affect, appropriate, no AH/VH Pelvic:  deferred  Labs/Studies:   CBC and Coags:  Lab Results  Component Value Date   WBC 14.6 (H) 04/29/2023   NEUTOPHILPCT 58 11/15/2019   EOSPCT 3 11/15/2019   BASOPCT 1 11/15/2019   LYMPHOPCT 32 11/15/2019   HGB 10.5 (L) 04/29/2023   HCT 30.9 (L) 04/29/2023   MCV 84.0 04/29/2023   PLT 241 04/29/2023   CMP:  Lab Results  Component Value Date   NA 138 11/15/2019   K 4.0 11/15/2019   CL 103 11/15/2019   CO2 24 11/15/2019   BUN 13 11/15/2019   CREATININE 0.70 11/15/2019   CREATININE 0.55  11/05/2016   CREATININE 0.46 (L) 04/16/2013   PROT 7.3 11/05/2016   BILITOT 0.4 11/05/2016   ALT 33 11/05/2016   AST 36 11/05/2016   ALKPHOS 61 11/05/2016   Other Labs:  Lab Results  Component Value Date   HCGBETAQNT 53,410 (H) 10/28/2012    Other Imaging: US PELVIS (TRANSABDOMINAL ONLY)  Result Date: 05/05/2023 CLINICAL DATA:  Retained products of conception. Back pain and cramping since vaginal delivery 04/28/2023. EXAM: TRANSABDOMINAL ULTRASOUND OF PELVIS TECHNIQUE: Transabdominal ultrasound examination of the pelvis was performed including evaluation of the uterus, ovaries, adnexal regions, and pelvic cul-de-sac. COMPARISON:  OB ultrasound 12/24/2022 FINDINGS: Uterus Measurements: 14.8 x 8.1 x 11.1 cm = volume: 699 mL. No fibroids or other mass visualized. Endometrium Thickness: 26 mm. The endometrium is heterogeneous but no abnormal increased color flow Doppler vascularity is seen to suggest hypervascular retained products of conception. Neither ovary was able to be visualized. Other findings:  Trace free fluid on the left. IMPRESSION: 1. Heterogeneous endometrium without abnormal increased color flow Doppler vascularity to specifically suggest hypervascular retained products of conception. 2. Trace free fluid on the left. Electronically Signed   By: Neita Garnet M.D.   On: 05/05/2023  12:12     Assessment / Plan:   Crissa D Paar is a 37 y.o. [id number] who presents with retained products after delivery  Management options discussed, including: Expectant, medical and surgical.  Pt has opted for: suction D&C  Consents signed today. Risks of surgery were discussed with the patient including but not limited to: bleeding which may require transfusion; infection which may require antibiotics; injury to uterus or surrounding organs; intrauterine scarring which may impair future fertility; need for additional procedures including laparotomy or laparoscopy; and other postoperative/anesthesia complications. Written informed consent was obtained.  This is a scheduled same-day surgery. She will have a postop visit in 2 weeks to review operative findings and pathology.

## 2023-05-06 NOTE — Discharge Instructions (Addendum)
Discharge instructions after a Dilation and Curettage  Signs and Symptoms to Report  Call our office at [phone] if you have any of the following:    Fever over 100.4 degrees or higher  Severe stomach pain not relieved with pain medications  Bright red bleeding that's heavier than a period that does not slow with rest after the first 24 hours  To go the bathroom a lot (frequency), you can't hold your urine (urgency), or it hurts when you empty your bladder (urinate)  Chest pain  Shortness of breath  Pain in the calves of your legs  Severe nausea and vomiting not relieved with anti-nausea medications  Any concerns  What You Can Expect after Surgery  You may see some pink tinged, bloody fluid. This is normal. You may also have cramping for several days.   Activities after Your Discharge Follow these guidelines to help speed your recovery at home:  Don't drive if you are in pain or taking narcotic pain medicine. You may drive when you can safely slam on the brakes, turn the wheel forcefully, and rotate your torso comfortably. This is typically 4-7 days. Practice in a parking lot or side street prior to attempting to drive regularly.   Ask others to help with household chores until you feel up to doing tasks.  Don't do strenuous activities, exercises, or sports like vacuuming, tennis, squash, etc. until your doctor says it is safe to do so.  Walk as you feel able. Rest often since it may take a week or two for your energy level to return to normal.   You may climb stairs  Avoid constipation:   -Eat fruits, vegetables, and whole grains. Eat small meals as your appetite will take time to return to normal.   -Drink 6 to 8 glasses of water each day unless your doctor has told you to limit your fluids.   -Use a laxative or stool softener as needed if constipation becomes a problem. You may take Miralax, metamucil, Citrucil, Colace, Senekot, FiberCon, etc. If this does not relieve the  constipation, try two tablespoons of Milk Of Magnesia every 8 hours until your bowels move.   You may shower.   Do not get in a hot tub, swimming pool, etc. until your doctor agrees.  Do not douche, use tampons, or have sex until you stop spotting, usually about 2 weeks.  Take your pain medicine when you need it. The medicine may not work as well if the pain is bad.  Take the medicines you were taking before surgery. Other medications you might need are pain medications (ibuprofen), medications for constipation (Colace) and nausea medications (Zofran).    AMBULATORY SURGERY  DISCHARGE INSTRUCTIONS   The drugs that you were given will stay in your system until tomorrow so for the next 24 hours you should not:  Drive an automobile Make any legal decisions Drink any alcoholic beverage   You may resume regular meals tomorrow.  Today it is better to start with liquids and gradually work up to solid foods.  You may eat anything you prefer, but it is better to start with liquids, then soup and crackers, and gradually work up to solid foods.   Please notify your doctor immediately if you have any unusual bleeding, trouble breathing, redness and pain at the surgery site, drainage, fever, or pain not relieved by medication.      Additional Instructions:

## 2023-05-06 NOTE — Anesthesia Procedure Notes (Signed)
Procedure Name: Intubation Date/Time: 05/06/2023 12:23 PM  Performed by: Ginger Carne, CRNAPre-anesthesia Checklist: Patient identified, Emergency Drugs available, Suction available, Patient being monitored and Timeout performed Patient Re-evaluated:Patient Re-evaluated prior to induction Oxygen Delivery Method: Circle system utilized Preoxygenation: Pre-oxygenation with 100% oxygen Induction Type: IV induction Ventilation: Mask ventilation without difficulty and Oral airway inserted - appropriate to patient size Laryngoscope Size: McGraph and 3 Grade View: Grade I Tube type: Oral Tube size: 6.5 mm Number of attempts: 1 Airway Equipment and Method: Stylet and Video-laryngoscopy Placement Confirmation: ETT inserted through vocal cords under direct vision, positive ETCO2 and breath sounds checked- equal and bilateral Secured at: 21 cm Tube secured with: Tape Dental Injury: Teeth and Oropharynx as per pre-operative assessment

## 2023-05-06 NOTE — Anesthesia Preprocedure Evaluation (Signed)
Anesthesia Evaluation  Patient identified by MRN, date of birth, ID band Patient awake    Reviewed: Allergy & Precautions, NPO status , Patient's Chart, lab work & pertinent test results  Airway Mallampati: III  TM Distance: >3 FB Neck ROM: full    Dental  (+) Dental Advidsory Given, Teeth Intact   Pulmonary neg pulmonary ROS, former smoker   Pulmonary exam normal        Cardiovascular hypertension, Normal cardiovascular exam     Neuro/Psych negative neurological ROS  negative psych ROS   GI/Hepatic Neg liver ROS,GERD  Medicated,,  Endo/Other  diabetesHypothyroidism  Morbid obesity  Renal/GU      Musculoskeletal   Abdominal   Peds  Hematology negative hematology ROS (+)   Anesthesia Other Findings Past Medical History: No date: Chicken pox No date: Elevated BP without diagnosis of hypertension No date: GERD (gastroesophageal reflux disease) No date: Gestational diabetes No date: Headache No date: History of kidney stones No date: History of UTI No date: Thyroid disease  Past Surgical History: No date: CHOLECYSTECTOMY 2002: KIDNEY SURGERY     Comment:  h/o kidney reflux? No date: TONSILLECTOMY 10/20/2022: WISDOM TOOTH EXTRACTION; Right     Reproductive/Obstetrics negative OB ROS                             Anesthesia Physical Anesthesia Plan  ASA: 3  Anesthesia Plan: General ETT   Post-op Pain Management:    Induction: Intravenous  PONV Risk Score and Plan: 4 or greater and Ondansetron, Dexamethasone and Midazolam  Airway Management Planned: Oral ETT  Additional Equipment:   Intra-op Plan:   Post-operative Plan: Extubation in OR  Informed Consent: I have reviewed the patients History and Physical, chart, labs and discussed the procedure including the risks, benefits and alternatives for the proposed anesthesia with the patient or authorized representative who has  indicated his/her understanding and acceptance.     Dental Advisory Given  Plan Discussed with: Anesthesiologist, CRNA and Surgeon  Anesthesia Plan Comments: (Patient consented for risks of anesthesia including but not limited to:  - adverse reactions to medications - damage to eyes, teeth, lips or other oral mucosa - nerve damage due to positioning  - sore throat or hoarseness - Damage to heart, brain, nerves, lungs, other parts of body or loss of life  Patient voiced understanding.)       Anesthesia Quick Evaluation

## 2023-05-07 LAB — URINE CULTURE: Culture: NO GROWTH

## 2023-05-07 NOTE — Anesthesia Postprocedure Evaluation (Signed)
Anesthesia Post Note  Patient: CYARA DEVOTO  Procedure(s) Performed: SUCTION DILATATION AND CURETTAGE  Patient location during evaluation: PACU Anesthesia Type: General Level of consciousness: awake and alert Pain management: pain level controlled Vital Signs Assessment: post-procedure vital signs reviewed and stable Respiratory status: spontaneous breathing, nonlabored ventilation, respiratory function stable and patient connected to nasal cannula oxygen Cardiovascular status: blood pressure returned to baseline and stable Postop Assessment: no apparent nausea or vomiting Anesthetic complications: no   No notable events documented.   Last Vitals:  Vitals:   05/06/23 1700 05/06/23 1729  BP: (!) 141/67 (!) 159/70  Pulse:  (!) 52  Resp: 19 20  Temp: (!) 36.4 C 36.9 C  SpO2: 96% 97%    Last Pain:  Vitals:   05/06/23 1729  TempSrc: Temporal  PainSc: 0-No pain                 Stephanie Coup

## 2023-05-08 ENCOUNTER — Other Ambulatory Visit: Payer: Self-pay | Admitting: Obstetrics and Gynecology

## 2023-05-08 ENCOUNTER — Encounter: Payer: Self-pay | Admitting: Obstetrics and Gynecology

## 2023-05-11 NOTE — Progress Notes (Signed)
noted 

## 2023-05-19 NOTE — H&P (Signed)
History of Present Illness: Preop Visit for Interval BTL  The patient is postpartum from a vaginal delivery. She presents to discuss permanent sterilization. I  Pertinent Hx: Elevated BP D&C for retained placenta Body mass index is 40.93 kg/m.   Past Medical History:  has a past medical history of Anxiety, Depression, GERD (gastroesophageal reflux disease), Hyperlipidemia, Hypertension, and Thyroid disease.  Past Surgical History:  has a past surgical history that includes Cholecystectomy (2004); Replacement of ureter (Bilateral, 1999); and dilation & currettage (05/06/2023). Family History: family history includes Breast cancer (age of onset: 33) in her paternal grandmother; Cancer in her maternal grandmother; Colon cancer (age of onset: 4) in her paternal grandmother; Depression in her maternal grandmother; Hyperlipidemia (Elevated cholesterol) in her maternal grandfather, maternal grandmother, and mother; Lung cancer (age of onset: 98) in her paternal grandmother; Myocardial Infarction (Heart attack) (age of onset: 22) in her father; No Known Problems in her son; Sudden cardiac death (age of onset: 46) in her father; Thyroid disease in her maternal grandfather and sister. Social History:  reports that she quit smoking about 6 years ago. Her smoking use included cigarettes. She started smoking about 8 years ago. She has never used smokeless tobacco. She reports that she does not currently use alcohol. She reports that she does not use drugs. OB/GYN History:  OB History    Gravida 2  Para 2  Term 2  Preterm    AB    Living 2    SAB    IAB    Ectopic    Molar    Multiple    Live Births 2       Allergies: has No Known Allergies. Medications:  Current Outpatient Medications:    labetaloL (TRANDATE) 200 MG tablet, Take 1 tablet (200 mg total) by mouth 2 (two) times daily, Disp: 60 tablet, Rfl: 11   levothyroxine (SYNTHROID) 100 MCG tablet, Take 1  tablet (100 mcg total) by mouth once daily, Disp: 90 tablet, Rfl: 3   levothyroxine (SYNTHROID) 75 MCG tablet, Take 1 tablet (75 mcg total) by mouth once daily Take on an empty stomach with a glass of water at least 30-60 minutes before breakfast., Disp: 90 tablet, Rfl: 3   omeprazole (PRILOSEC) 40 MG DR capsule, Take 1 capsule (40 mg total) by mouth once daily, Disp: 30 capsule, Rfl: 11   acetaminophen (TYLENOL) 325 MG tablet, Take by mouth (Patient not taking: Reported on 05/19/2023), Disp: , Rfl:    amoxicillin-clavulanate (AUGMENTIN) 875-125 mg tablet, Take 1 tablet by mouth 2 (two) times daily (Patient not taking: Reported on 05/19/2023), Disp: , Rfl:    aspirin 81 MG EC tablet, Take 81 mg by mouth once daily (Patient not taking: Reported on 05/04/2023), Disp: , Rfl:    blood glucose diagnostic test strip, 1 each (1 strip total) 4 (four) times daily Use as instructed. Please fill one covered by patient's insurance (Patient not taking: Reported on 05/04/2023), Disp: 120 each, Rfl: 12   blood glucose meter kit, as directed Please fill one covered by patient's insurance (Patient not taking: Reported on 05/04/2023), Disp: 1 each, Rfl: 0   butalbital-acetaminophen-caffeine (FIORICET) 50-325-40 mg tablet, Take 1 tablet by mouth every 4 (four) hours as needed for Pain (Patient not taking: Reported on 05/04/2023), Disp: 2 tablet, Rfl: 0   cyanocobalamin (VITAMIN B12) 1,000 mcg/mL injection, Inject 1 mL (1,000 mcg total) into the muscle once a week (Patient not taking: Reported on 05/19/2023), Disp: 1 mL, Rfl: 3  FLUoxetine (PROZAC) 20 MG capsule, Take 20 mg by mouth once daily (Patient not taking: Reported on 05/04/2023), Disp: , Rfl:    folic acid (FOLVITE) 1 MG tablet, Take 1 tablet (1 mg total) by mouth once daily (Patient not taking: Reported on 12/31/2022), Disp: 30 tablet, Rfl: 11   HYDROcodone-acetaminophen (NORCO) 5-325 mg tablet, take 1 tablet by mouth every 4-6 hours as needed for pain (Patient not taking:  Reported on 05/19/2023), Disp: , Rfl:    hydrocortisone 2.5 % cream, Apply topically 2 (two) times daily (Patient not taking: Reported on 03/20/2023), Disp: 30 g, Rfl: 2   ibuprofen (MOTRIN) 600 MG tablet, Take by mouth (Patient not taking: Reported on 05/19/2023), Disp: , Rfl:    insulin GLARGINE (LANTUS SOLOSTAR U-100 INSULIN) pen injector (concentration 100 units/mL), Inject 35 units in the morning subcutaneous and inject 25 units at night (Patient not taking: Reported on 05/04/2023), Disp: 15 mL, Rfl: 12   labetaloL (TRANDATE) 200 MG tablet, Take 2 tablets (400 mg) by mouth in the morning with breakfast, then 1 tablet (200 mg) by mouth at lunch, then 2 tablets (400 mg) at bedtime for 30 days, Disp: 150 tablet, Rfl: 0   lancets, Use 1 each 4 (four) times daily Use as instructed. Please fill one covered by patients insurance (Patient not taking: Reported on 05/04/2023), Disp: 120 each, Rfl: 12   levothyroxine (SYNTHROID) 88 MCG tablet, Take 1 tablet (88 mcg total) by mouth once daily (Patient not taking: Reported on 03/04/2023), Disp: 90 tablet, Rfl: 3   ondansetron (ZOFRAN) 4 MG tablet, Take 4 mg by mouth every 8 (eight) hours as needed for Nausea (Patient not taking: Reported on 05/04/2023), Disp: , Rfl:    ondansetron (ZOFRAN-ODT) 4 MG disintegrating tablet, Take 4 mg by mouth every 8 (eight) hours as needed (Patient not taking: Reported on 05/19/2023), Disp: , Rfl:    pen needle, diabetic 29 gauge x 1/2" needle, Use as directed (Patient not taking: Reported on 05/04/2023), Disp: 100 each, Rfl: 12   promethazine (PHENERGAN) 25 MG tablet, Take 1 tablet (25 mg total) by mouth every 6 (six) hours as needed for Nausea (Patient not taking: Reported on 03/20/2023), Disp: 30 tablet, Rfl: 0   triamcinolone 0.1 % cream, Apply topically 2 (two) times daily (Patient not taking: Reported on 05/19/2023), Disp: 30 g, Rfl: 0  Current Facility-Administered Medications:    cyanocobalamin (VITAMIN B12) injection 1,000 mcg, 1,000  mcg, Intramuscular, Q7 Days, Kathaleen Grinder, CNM, 1,000 mcg at 04/09/23 1016  Review of Systems: No SOB, no palpitations or chest pain, no new lower extremity edema, no nausea or vomiting or bowel or bladder complaints. See HPI for gyn specific ROS.   Exam:  BP (!) 158/104   Pulse (!) 120   Ht 152.4 cm (5')   Wt 95.1 kg (209 lb 9.6 oz)   LMP 08/04/2022   Breastfeeding No   BMI 40.93 kg/m   General: Patient is well-groomed, well-nourished, appears stated age in no acute distress   HEENT: head is atraumatic and normocephalic, trachea is midline, neck is supple with no palpable nodules   CV: Regular rhythm and normal heart rate, no murmur   Pulm: Clear to auscultation throughout lung fields with no wheezing, crackles, or rhonchi. No increased work of breathing  Abdomen: soft , no mass, non-tender, no rebound tenderness, no hepatomegaly  Pelvic:  Deferred  A female chaperone was present for the more sensitive portions of the physical exam ( such as breast  and pelvic)  Impression:  The primary encounter diagnosis was Preeclampsia in postpartum period ([facility]). A diagnosis of Postop check was also pertinent to this visit.  Plan:  1. Request for Permanent Sterilization  -Patient desires surgical sterilization.  Patient has been counseled on alternate forms of contraception including hormonal forms, IUD's and barrier methods. She has been counseled on risks of surgical sterilization including bleeding, infection, pain, injury during procedure, risk of need for further procedures/surgeries due to injury or abnormalities at the time of surgery, thromboembolic events, exacerbation of ongoing medical conditions, risk of ectopic pregnancy, risk of failure of procedure to prevent pregnancy, medication reactions as well as the risk of anesthesia.  Patient verbalizes understanding.  Consent form signed.  Preoperative and postoperative instructions provided. Written and verbal education  provided.  No barriers to learning.   Return in about 1 week (around 05/26/2023) for BP check .  Cecilie Kicks, MD

## 2023-05-26 DIAGNOSIS — U071 COVID-19: Secondary | ICD-10-CM

## 2023-05-26 HISTORY — DX: COVID-19: U07.1

## 2023-05-28 ENCOUNTER — Inpatient Hospital Stay: Admission: RE | Admit: 2023-05-28 | Payer: BC Managed Care – PPO | Source: Ambulatory Visit

## 2023-06-03 ENCOUNTER — Encounter
Admission: RE | Admit: 2023-06-03 | Discharge: 2023-06-03 | Disposition: A | Discharge status: Home / Self Care | Payer: BC Managed Care – PPO | Source: Ambulatory Visit | Attending: Obstetrics and Gynecology | Admitting: Obstetrics and Gynecology

## 2023-06-03 ENCOUNTER — Other Ambulatory Visit: Payer: Self-pay

## 2023-06-03 DIAGNOSIS — Z01812 Encounter for preprocedural laboratory examination: Secondary | ICD-10-CM | POA: Diagnosis present

## 2023-06-03 DIAGNOSIS — Z3009 Encounter for other general counseling and advice on contraception: Secondary | ICD-10-CM

## 2023-06-03 HISTORY — DX: Hypothyroidism, unspecified: E03.9

## 2023-06-03 HISTORY — DX: Anemia, unspecified: D64.9

## 2023-06-03 HISTORY — DX: Essential (primary) hypertension: I10

## 2023-06-03 HISTORY — DX: Depression, unspecified: F32.A

## 2023-06-03 LAB — CBC
HCT: 36.6 % (ref 36.0–46.0)
Hemoglobin: 11.8 g/dL — ABNORMAL LOW (ref 12.0–15.0)
MCH: 27.2 pg (ref 26.0–34.0)
MCHC: 32.2 g/dL (ref 30.0–36.0)
MCV: 84.3 fL (ref 80.0–100.0)
Platelets: 368 10*3/uL (ref 150–400)
RBC: 4.34 MIL/uL (ref 3.87–5.11)
RDW: 13.2 % (ref 11.5–15.5)
WBC: 9.4 10*3/uL (ref 4.0–10.5)
nRBC: 0 % (ref 0.0–0.2)

## 2023-06-03 NOTE — Patient Instructions (Addendum)
Your procedure is scheduled on: 06/08/23 - Monday Report to the Registration Desk on the 1st floor of the [facility]. To find out your arrival time, please call [phone] between 1PM - 3PM on: 06/05/23 - Friday If your arrival time is 6:00 am, do not arrive before that time as the [facility] entrance doors do not open until 6:00 am.  REMEMBER: Instructions that are not followed completely may result in serious medical risk, up to and including death; or upon the discretion of your surgeon and anesthesiologist your surgery may need to be rescheduled.  Do not eat food after midnight the night before surgery.  No gum chewing or hard candies.  You may however, drink CLEAR liquids up to 2 hours before you are scheduled to arrive for your surgery. Do not drink anything within 2 hours of your scheduled arrival time.  Clear liquids include: - water  - apple juice without pulp - gatorade (not RED colors) - black coffee or tea (Do NOT add milk or creamers to the coffee or tea) Do NOT drink anything that is not on this list.   One week prior to surgery: Stop Anti-inflammatories (NSAIDS) such as Advil, Aleve, Ibuprofen, Motrin, Naproxen, Naprosyn and Aspirin based products such as Excedrin, Goody's Powder, BC Powder. You may however, continue to take Tylenol if needed for pain up until the day of surgery.  Stop ANY OVER THE COUNTER supplements until after surgery.   TAKE ONLY THESE MEDICATIONS THE MORNING OF SURGERY WITH A SIP OF WATER:  omeprazole (PRILOSEC) - (take one the night before and one on the morning of surgery - helps to prevent nausea after surgery.) levothyroxine (SYNTHROID)    No Alcohol for 24 hours before or after surgery.  No Smoking including e-cigarettes for 24 hours before surgery.  No chewable tobacco products for at least 6 hours before surgery.  No nicotine patches on the day of surgery.  Do not use any "recreational" drugs for at least a week (preferably 2  weeks) before your surgery.  Please be advised that the combination of cocaine and anesthesia may have negative outcomes, up to and including death. If you test positive for cocaine, your surgery will be cancelled.  On the morning of surgery brush your teeth with toothpaste and water, you may rinse your mouth with mouthwash if you wish. Do not swallow any toothpaste or mouthwash.  Use CHG Soap or wipes as directed on instruction sheet.  Do not wear jewelry, make-up, hairpins, clips or nail polish.  Do not wear lotions, powders, or perfumes.   Do not shave body hair from the neck down 48 hours before surgery.  Contact lenses, hearing aids and dentures may not be worn into surgery.  Do not bring valuables to the hospital. [facility] is not responsible for any missing/lost belongings or valuables.   Notify your doctor if there is any change in your medical condition (cold, fever, infection).  Wear comfortable clothing (specific to your surgery type) to the hospital.  After surgery, you can help prevent lung complications by doing breathing exercises.  Take deep breaths and cough every 1-2 hours. Your doctor may order a device called an Incentive Spirometer to help you take deep breaths. When coughing or sneezing, hold a pillow firmly against your incision with both hands. This is called "splinting." Doing this helps protect your incision. It also decreases belly discomfort.  If you are being admitted to the hospital overnight, leave your suitcase in the car.  After surgery it may be brought to your room.  In case of increased patient census, it may be necessary for you, the patient, to continue your postoperative care in the Same Day Surgery department.  If you are being discharged the day of surgery, you will not be allowed to drive home. You will need a responsible individual to drive you home and stay with you for 24 hours after surgery.   If you are taking public transportation,  you will need to have a responsible individual with you.  Please call the Pre-admissions Testing Dept. at [phone] if you have any questions about these instructions.  Surgery Visitation Policy:  Patients having surgery or a procedure may have two visitors.  Children under the age of 14 must have an adult with them who is not the patient.  Inpatient Visitation:    Visiting hours are 7 a.m. to 8 p.m. Up to four visitors are allowed at one time in a patient room. The visitors may rotate out with other people during the day.  One visitor age 73 or older may stay with the patient overnight and must be in the room by 8 p.m.     Preparing for Surgery with CHLORHEXIDINE GLUCONATE (CHG) Soap  Chlorhexidine Gluconate (CHG) Soap  o An antiseptic cleaner that kills germs and bonds with the skin to continue killing germs even after washing  o Used for showering the night before surgery and morning of surgery  Before surgery, you can play an important role by reducing the number of germs on your skin.  CHG (Chlorhexidine gluconate) soap is an antiseptic cleanser which kills germs and bonds with the skin to continue killing germs even after washing.  Please do not use if you have an allergy to CHG or antibacterial soaps. If your skin becomes reddened/irritated stop using the CHG.  1. Shower the NIGHT BEFORE SURGERY and the MORNING OF SURGERY with CHG soap.  2. If you choose to wash your hair, wash your hair first as usual with your normal shampoo.  3. After shampooing, rinse your hair and body thoroughly to remove the shampoo.  4. Use CHG as you would any other liquid soap. You can apply CHG directly to the skin and wash gently with a scrungie or a clean washcloth.  5. Apply the CHG soap to your body only from the neck down. Do not use on open wounds or open sores. Avoid contact with your eyes, ears, mouth, and genitals (private parts). Wash face and genitals (private parts) with your  normal soap.  6. Wash thoroughly, paying special attention to the area where your surgery will be performed.  7. Thoroughly rinse your body with warm water.  8. Do not shower/wash with your normal soap after using and rinsing off the CHG soap.  9. Pat yourself dry with a clean towel.  10. Wear clean pajamas to bed the night before surgery.  12. Place clean sheets on your bed the night of your first shower and do not sleep with pets.  13. Shower again with the CHG soap on the day of surgery prior to arriving at the hospital.  14. Do not apply any deodorants/lotions/powders.  15. Please wear clean clothes to the hospital.  How to Use an Incentive Spirometer  An incentive spirometer is a tool that measures how well you are filling your lungs with each breath. Learning to take long, deep breaths using this tool can help you keep your lungs clear and  active. This may help to reverse or lessen your chance of developing breathing (pulmonary) problems, especially infection. You may be asked to use a spirometer: After a surgery. If you have a lung problem or a history of smoking. After a long period of time when you have been unable to move or be active. If the spirometer includes an indicator to show the highest number that you have reached, your health care provider or respiratory therapist will help you set a goal. Keep a log of your progress as told by your health care provider. What are the risks? Breathing too quickly may cause dizziness or cause you to pass out. Take your time so you do not get dizzy or light-headed. If you are in pain, you may need to take pain medicine before doing incentive spirometry. It is harder to take a deep breath if you are having pain. How to use your incentive spirometer  Sit up on the edge of your bed or on a chair. Hold the incentive spirometer so that it is in an upright position. Before you use the spirometer, breathe out normally. Place the  mouthpiece in your mouth. Make sure your lips are closed tightly around it. Breathe in slowly and as deeply as you can through your mouth, causing the piston or the ball to rise toward the top of the chamber. Hold your breath for 3-5 seconds, or for as long as possible. If the spirometer includes a coach indicator, use this to guide you in breathing. Slow down your breathing if the indicator goes above the marked areas. Remove the mouthpiece from your mouth and breathe out normally. The piston or ball will return to the bottom of the chamber. Rest for a few seconds, then repeat the steps 10 or more times. Take your time and take a few normal breaths between deep breaths so that you do not get dizzy or light-headed. Do this every 1-2 hours when you are awake. If the spirometer includes a goal marker to show the highest number you have reached (best effort), use this as a goal to work toward during each repetition. After each set of 10 deep breaths, cough a few times. This will help to make sure that your lungs are clear. If you have an incision on your chest or abdomen from surgery, place a pillow or a rolled-up towel firmly against the incision when you cough. This can help to reduce pain while taking deep breaths and coughing. General tips When you are able to get out of bed: Walk around often. Continue to take deep breaths and cough in order to clear your lungs. Keep using the incentive spirometer until your health care provider says it is okay to stop using it. If you have been in the hospital, you may be told to keep using the spirometer at home. Contact a health care provider if: You are having difficulty using the spirometer. You have trouble using the spirometer as often as instructed. Your pain medicine is not giving enough relief for you to use the spirometer as told. You have a fever. Get help right away if: You develop shortness of breath. You develop a cough with bloody mucus from  the lungs. You have fluid or blood coming from an incision site after you cough. Summary An incentive spirometer is a tool that can help you learn to take long, deep breaths to keep your lungs clear and active. You may be asked to use a spirometer after a surgery, if  you have a lung problem or a history of smoking, or if you have been inactive for a long period of time. Use your incentive spirometer as instructed every 1-2 hours while you are awake. If you have an incision on your chest or abdomen, place a pillow or a rolled-up towel firmly against your incision when you cough. This will help to reduce pain. Get help right away if you have shortness of breath, you cough up bloody mucus, or blood comes from your incision when you cough. This information is not intended to replace advice given to you by your health care provider. Make sure you discuss any questions you have with your health care provider. Document Revised: 01/02/2020 Document Reviewed: 01/02/2020 Elsevier Patient Education  2023 [employer].

## 2023-06-04 DIAGNOSIS — Z3009 Encounter for other general counseling and advice on contraception: Secondary | ICD-10-CM

## 2023-06-08 ENCOUNTER — Ambulatory Visit: Payer: BC Managed Care – PPO | Admitting: Anesthesiology

## 2023-06-08 ENCOUNTER — Encounter: Payer: Self-pay | Admitting: Obstetrics and Gynecology

## 2023-06-08 ENCOUNTER — Other Ambulatory Visit: Payer: Self-pay

## 2023-06-08 ENCOUNTER — Ambulatory Visit
Admission: RE | Admit: 2023-06-08 | Discharge: 2023-06-08 | Disposition: A | Discharge status: Home / Self Care | Payer: BC Managed Care – PPO | Attending: Obstetrics and Gynecology | Admitting: Obstetrics and Gynecology

## 2023-06-08 ENCOUNTER — Encounter
Admission: RE | Disposition: A | Discharge status: Home / Self Care | Payer: Self-pay | Source: Home / Self Care | Attending: Obstetrics and Gynecology

## 2023-06-08 DIAGNOSIS — F32A Depression, unspecified: Secondary | ICD-10-CM | POA: Insufficient documentation

## 2023-06-08 DIAGNOSIS — Z3009 Encounter for other general counseling and advice on contraception: Secondary | ICD-10-CM

## 2023-06-08 DIAGNOSIS — Z6841 Body Mass Index (BMI) 40.0 and over, adult: Secondary | ICD-10-CM | POA: Insufficient documentation

## 2023-06-08 DIAGNOSIS — E039 Hypothyroidism, unspecified: Secondary | ICD-10-CM | POA: Insufficient documentation

## 2023-06-08 DIAGNOSIS — I10 Essential (primary) hypertension: Secondary | ICD-10-CM | POA: Insufficient documentation

## 2023-06-08 DIAGNOSIS — Z01812 Encounter for preprocedural laboratory examination: Secondary | ICD-10-CM

## 2023-06-08 DIAGNOSIS — Z87891 Personal history of nicotine dependence: Secondary | ICD-10-CM | POA: Insufficient documentation

## 2023-06-08 DIAGNOSIS — Z302 Encounter for sterilization: Secondary | ICD-10-CM | POA: Diagnosis present

## 2023-06-08 DIAGNOSIS — E669 Obesity, unspecified: Secondary | ICD-10-CM | POA: Insufficient documentation

## 2023-06-08 DIAGNOSIS — F419 Anxiety disorder, unspecified: Secondary | ICD-10-CM | POA: Insufficient documentation

## 2023-06-08 HISTORY — PX: LAPAROSCOPIC TUBAL LIGATION: SHX1937

## 2023-06-08 LAB — POCT PREGNANCY, URINE: Preg Test, Ur: NEGATIVE

## 2023-06-08 SURGERY — LIGATION, FALLOPIAN TUBE, LAPAROSCOPIC
Anesthesia: General | Site: Abdomen | Laterality: Bilateral

## 2023-06-08 MED ORDER — LACTATED RINGERS IV SOLN: INTRAVENOUS | Status: DC

## 2023-06-08 MED ORDER — POVIDONE-IODINE 10 % EX SWAB: 2.0000 | Freq: Once | CUTANEOUS | Status: DC

## 2023-06-08 MED ORDER — OXYCODONE HCL 5 MG PO TABS
5.0000 mg | ORAL_TABLET | Freq: Once | ORAL | Status: AC | PRN
  Administered 2023-06-08: 5 mg via ORAL

## 2023-06-08 MED ORDER — LABETALOL HCL 5 MG/ML IV SOLN
INTRAVENOUS | Status: AC
  Filled 2023-06-08: qty 4

## 2023-06-08 MED ORDER — FENTANYL CITRATE (PF) 100 MCG/2ML IJ SOLN
INTRAMUSCULAR | Status: AC
  Filled 2023-06-08: qty 2

## 2023-06-08 MED ORDER — PROPOFOL 10 MG/ML IV BOLUS
INTRAVENOUS | Status: DC | PRN
Start: 2023-06-08 — End: 2023-06-08
  Administered 2023-06-08: 200 mg via INTRAVENOUS

## 2023-06-08 MED ORDER — CHLORHEXIDINE GLUCONATE 0.12 % MT SOLN
OROMUCOSAL | Status: AC
  Filled 2023-06-08: qty 15

## 2023-06-08 MED ORDER — ONDANSETRON HCL 4 MG/2ML IJ SOLN
INTRAMUSCULAR | Status: AC
  Filled 2023-06-08: qty 4

## 2023-06-08 MED ORDER — GABAPENTIN 300 MG PO CAPS
300.0000 mg | ORAL_CAPSULE | ORAL | Status: AC
  Administered 2023-06-08: 300 mg via ORAL

## 2023-06-08 MED ORDER — ROCURONIUM BROMIDE 100 MG/10ML IV SOLN
INTRAVENOUS | Status: DC | PRN
  Administered 2023-06-08: 50 mg via INTRAVENOUS

## 2023-06-08 MED ORDER — KETOROLAC TROMETHAMINE 30 MG/ML IJ SOLN
INTRAMUSCULAR | Status: AC
  Filled 2023-06-08: qty 1

## 2023-06-08 MED ORDER — DEXAMETHASONE SODIUM PHOSPHATE 10 MG/ML IJ SOLN
INTRAMUSCULAR | Status: DC | PRN
  Administered 2023-06-08: 10 mg via INTRAVENOUS

## 2023-06-08 MED ORDER — OXYCODONE HCL 5 MG/5ML PO SOLN: 5.0000 mg | Freq: Once | ORAL | Status: AC | PRN

## 2023-06-08 MED ORDER — ROCURONIUM BROMIDE 10 MG/ML (PF) SYRINGE
PREFILLED_SYRINGE | INTRAVENOUS | Status: AC
  Filled 2023-06-08: qty 10

## 2023-06-08 MED ORDER — ACETAMINOPHEN 500 MG PO TABS
1000.0000 mg | ORAL_TABLET | ORAL | Status: AC
  Administered 2023-06-08: 1000 mg via ORAL

## 2023-06-08 MED ORDER — BUPIVACAINE HCL 0.5 % IJ SOLN
INTRAMUSCULAR | Status: DC | PRN
  Administered 2023-06-08: 20 mL

## 2023-06-08 MED ORDER — 0.9 % SODIUM CHLORIDE (POUR BTL) OPTIME
TOPICAL | Status: DC | PRN
  Administered 2023-06-08: 500 mL

## 2023-06-08 MED ORDER — CHLORHEXIDINE GLUCONATE 0.12 % MT SOLN
15.0000 mL | Freq: Once | OROMUCOSAL | Status: AC
  Administered 2023-06-08: 15 mL via OROMUCOSAL

## 2023-06-08 MED ORDER — BUPIVACAINE HCL (PF) 0.5 % IJ SOLN
INTRAMUSCULAR | Status: AC
  Filled 2023-06-08: qty 30

## 2023-06-08 MED ORDER — DEXAMETHASONE SODIUM PHOSPHATE 10 MG/ML IJ SOLN
INTRAMUSCULAR | Status: AC
  Filled 2023-06-08: qty 2

## 2023-06-08 MED ORDER — FENTANYL CITRATE (PF) 100 MCG/2ML IJ SOLN: 25.0000 ug | INTRAMUSCULAR | Status: DC | PRN

## 2023-06-08 MED ORDER — PROPOFOL 1000 MG/100ML IV EMUL
INTRAVENOUS | Status: AC
  Filled 2023-06-08: qty 100

## 2023-06-08 MED ORDER — PROPOFOL 10 MG/ML IV BOLUS
INTRAVENOUS | Status: AC
  Filled 2023-06-08: qty 20

## 2023-06-08 MED ORDER — MIDAZOLAM HCL 2 MG/2ML IJ SOLN
INTRAMUSCULAR | Status: AC
  Filled 2023-06-08: qty 2

## 2023-06-08 MED ORDER — IBUPROFEN 800 MG PO TABS
800.0000 mg | ORAL_TABLET | Freq: Three times a day (TID) | ORAL | 1 refills | Status: AC
Start: 2023-06-08 — End: 2023-06-11

## 2023-06-08 MED ORDER — OXYCODONE HCL 5 MG PO TABS: 5.0000 mg | ORAL_TABLET | ORAL | 0 refills | Status: DC | PRN

## 2023-06-08 MED ORDER — SILVER NITRATE-POT NITRATE 75-25 % EX MISC
CUTANEOUS | Status: AC
  Filled 2023-06-08: qty 40

## 2023-06-08 MED ORDER — PROPOFOL 500 MG/50ML IV EMUL
INTRAVENOUS | Status: DC | PRN
  Administered 2023-06-08: 150 ug/kg/min via INTRAVENOUS

## 2023-06-08 MED ORDER — DEXMEDETOMIDINE HCL IN NACL 80 MCG/20ML IV SOLN
INTRAVENOUS | Status: DC | PRN
  Administered 2023-06-08: 8 ug via INTRAVENOUS
  Administered 2023-06-08: 4 ug via INTRAVENOUS
  Administered 2023-06-08: 8 ug via INTRAVENOUS

## 2023-06-08 MED ORDER — ACETAMINOPHEN 10 MG/ML IV SOLN: 1000.0000 mg | Freq: Once | INTRAVENOUS | Status: DC | PRN

## 2023-06-08 MED ORDER — FENTANYL CITRATE (PF) 100 MCG/2ML IJ SOLN
INTRAMUSCULAR | Status: DC | PRN
  Administered 2023-06-08 (×2): 50 ug via INTRAVENOUS

## 2023-06-08 MED ORDER — DOCUSATE SODIUM 100 MG PO CAPS: 100.0000 mg | ORAL_CAPSULE | Freq: Two times a day (BID) | ORAL | 0 refills | Status: DC

## 2023-06-08 MED ORDER — ACETAMINOPHEN 500 MG PO TABS
ORAL_TABLET | ORAL | Status: AC
  Filled 2023-06-08: qty 2

## 2023-06-08 MED ORDER — OXYCODONE HCL 5 MG PO TABS
ORAL_TABLET | ORAL | Status: AC
  Filled 2023-06-08: qty 1

## 2023-06-08 MED ORDER — ONDANSETRON HCL 4 MG/2ML IJ SOLN
INTRAMUSCULAR | Status: DC | PRN
  Administered 2023-06-08: 4 mg via INTRAVENOUS

## 2023-06-08 MED ORDER — GABAPENTIN 300 MG PO CAPS
ORAL_CAPSULE | ORAL | Status: AC
  Filled 2023-06-08: qty 1

## 2023-06-08 MED ORDER — SUGAMMADEX SODIUM 200 MG/2ML IV SOLN
INTRAVENOUS | Status: DC | PRN
  Administered 2023-06-08: 200 mg via INTRAVENOUS

## 2023-06-08 MED ORDER — ORAL CARE MOUTH RINSE: 15.0000 mL | Freq: Once | OROMUCOSAL | Status: AC

## 2023-06-08 MED ORDER — ONDANSETRON HCL 4 MG/2ML IJ SOLN: 4.0000 mg | Freq: Once | INTRAMUSCULAR | Status: DC | PRN

## 2023-06-08 MED ORDER — MIDAZOLAM HCL 2 MG/2ML IJ SOLN
INTRAMUSCULAR | Status: DC | PRN
  Administered 2023-06-08: 2 mg via INTRAVENOUS

## 2023-06-08 MED ORDER — ACETAMINOPHEN EXTRA STRENGTH 500 MG PO TABS: 1000.0000 mg | ORAL_TABLET | Freq: Four times a day (QID) | ORAL | 0 refills | Status: AC

## 2023-06-08 MED ORDER — LABETALOL HCL 5 MG/ML IV SOLN
INTRAVENOUS | Status: DC | PRN
  Administered 2023-06-08: 5 mg via INTRAVENOUS

## 2023-06-08 MED ORDER — KETOROLAC TROMETHAMINE 30 MG/ML IJ SOLN
INTRAMUSCULAR | Status: DC | PRN
  Administered 2023-06-08: 30 mg via INTRAVENOUS

## 2023-06-08 MED ORDER — LIDOCAINE HCL (PF) 2 % IJ SOLN
INTRAMUSCULAR | Status: AC
  Filled 2023-06-08: qty 10

## 2023-06-08 MED ORDER — LIDOCAINE HCL (CARDIAC) PF 100 MG/5ML IV SOSY
PREFILLED_SYRINGE | INTRAVENOUS | Status: DC | PRN
  Administered 2023-06-08: 100 mg via INTRAVENOUS

## 2023-06-08 SURGICAL SUPPLY — 36 items
ADH SKN CLS APL DERMABOND .7 (GAUZE/BANDAGES/DRESSINGS) ×1
BLADE SURG SZ11 CARB STEEL (BLADE) ×1 IMPLANT
CATH ROBINSON RED A/P 16FR (CATHETERS) ×1 IMPLANT
DERMABOND ADVANCED .7 DNX12 (GAUZE/BANDAGES/DRESSINGS) ×1 IMPLANT
DRAPE UTILITY 15X26 TOWEL STRL (DRAPES) ×2 IMPLANT
DRSG TEGADERM 2-3/8X2-3/4 SM (GAUZE/BANDAGES/DRESSINGS) IMPLANT
GAUZE 4X4 16PLY ~~LOC~~+RFID DBL (SPONGE) IMPLANT
GAUZE SPONGE 2X2 STRL 8-PLY (GAUZE/BANDAGES/DRESSINGS) IMPLANT
GLOVE BIO SURGEON STRL SZ7 (GLOVE) ×2 IMPLANT
GLOVE INDICATOR 7.5 STRL GRN (GLOVE) ×2 IMPLANT
GOWN STRL REUS W/ TWL LRG LVL3 (GOWN DISPOSABLE) ×2 IMPLANT
GOWN STRL REUS W/TWL LRG LVL3 (GOWN DISPOSABLE) ×2
GRASPER SUT TROCAR 14GX15 (MISCELLANEOUS) IMPLANT
KIT PINK PAD W/HEAD ARE REST (MISCELLANEOUS) ×1
KIT PINK PAD W/HEAD ARM REST (MISCELLANEOUS) ×1 IMPLANT
KIT TURNOVER CYSTO (KITS) ×1 IMPLANT
LABEL OR SOLS (LABEL) ×1 IMPLANT
LIGASURE LAP MARYLAND 5MM 37CM (ELECTROSURGICAL) ×1 IMPLANT
MANIFOLD NEPTUNE II (INSTRUMENTS) ×1 IMPLANT
NS IRRIG 500ML POUR BTL (IV SOLUTION) ×1 IMPLANT
PACK GYN LAPAROSCOPIC (MISCELLANEOUS) ×1 IMPLANT
PAD OB MATERNITY 4.3X12.25 (PERSONAL CARE ITEMS) ×1 IMPLANT
PAD PREP OB/GYN DISP 24X41 (PERSONAL CARE ITEMS) ×1 IMPLANT
SCRUB CHG 4% DYNA-HEX 4OZ (MISCELLANEOUS) ×1 IMPLANT
SET TUBE SMOKE EVAC HIGH FLOW (TUBING) ×1 IMPLANT
SLEEVE Z-THREAD 5X100MM (TROCAR) ×2 IMPLANT
SOL PREP PVP 2OZ (MISCELLANEOUS) ×1
SOLUTION PREP PVP 2OZ (MISCELLANEOUS) ×1 IMPLANT
STRIP CLOSURE SKIN 1/4X4 (GAUZE/BANDAGES/DRESSINGS) IMPLANT
SUT MNCRL 4-0 (SUTURE) ×1
SUT MNCRL 4-0 27XMFL (SUTURE) ×1
SUT VIC AB 2-0 UR6 27 (SUTURE) ×1 IMPLANT
SUTURE MNCRL 4-0 27XMF (SUTURE) ×1 IMPLANT
TRAP FLUID SMOKE EVACUATOR (MISCELLANEOUS) ×1 IMPLANT
TROCAR Z-THREAD FIOS 5X100MM (TROCAR) ×1 IMPLANT
WATER STERILE IRR 500ML POUR (IV SOLUTION) ×1 IMPLANT

## 2023-06-08 NOTE — Op Note (Signed)
SABRIAH Santos 06/08/2023  PREOPERATIVE DIAGNOSIS:  Undesired fertility  POSTOPERATIVE DIAGNOSIS:  Undesired fertility  PROCEDURE:  Laparoscopic Bilateral Tubal Sterilization using Bipolar Coagulation    ANESTHESIA:  General endotracheal  ANESTHESIOLOGIST: Foye Deer, MD Anesthesiologist: Foye Deer, MD; Reed Breech, MD CRNA: Jeanine Luz, CRNA  SURGEON: Cline Cools, MD  COMPLICATIONS:  None immediate.  ESTIMATED BLOOD LOSS:  Less than 20 ml.  FLUIDS: 600 ml LR.  URINE OUTPUT:  75 ml of clear urine.  INDICATIONS: 37 y.o. [id number]  with undesired fertility, desires permanent sterilization. Other reversible forms of contraception were discussed with patient; she declines all other modalities.  Risks of procedure discussed with patient including permanence of method, bleeding, infection, injury to surrounding organs and need for additional procedures including laparotomy, risk of regret.  Failure risk of 0.5-1% with increased risk of ectopic gestation if pregnancy occurs was also discussed with patient.      FINDINGS:  Normal uterus, tubes, and ovaries.  TECHNIQUE:  The patient was taken to the operating room where general anesthesia was obtained without difficulty.  She was then placed in the dorsal lithotomy position and prepared and draped in sterile fashion.  The bladder was cathed for an estimated amount of clear urine. After an adequate timeout was performed, a bivalved speculum was then placed in the patient's vagina, and the anterior lip of cervix grasped with the single-tooth tenaculum.  The uterine manipulator was then advanced into the uterus.  The speculum was removed from the vagina.   Attention was then turned to the patient's abdomen where a 5-mm skin incision was made in the umbilical fold.  The Optiview 5-mm trocar and sleeve were then advanced without difficulty with the laparoscope under direct visualization into the abdomen.  The  abdomen was then insufflated with carbon dioxide gas and adequate pneumoperitoneum was obtained.  A survey of the patient's pelvis and abdomen revealed entirely normal anatomy.     The fallopian tubes were observed and found to be normal in appearance. A 5mm port was placed in the bilateral lower quadrants under direct visualization. A Ligasure device was then advanced through the operative port and used to coagulate and excise the mid to distal portion of the Fallopian tube, including the fimbriated ends.  Good blanching and coagulation was noted at the site of the application.  There was no bleeding noted in the mesosalpinx.  A similar process was carried out on the right fallopian tube allowing for bilateral tubal sterilization.   Good hemostasis was noted overall.  The instruments were then removed from the patient's abdomen and the skin was closed with Dermabond.  The uterine manipulator and the tenaculum were removed from the vagina without complications. The patient tolerated the procedure well.  Sponge, lap, and needle counts were correct times two.  The patient was then taken to the recovery room awake, extubated and in stable condition.

## 2023-06-08 NOTE — Interval H&P Note (Signed)
History and Physical Interval Note:  06/08/2023 7:26 AM  Desiree Santos  has presented today for surgery, with the diagnosis of desires sterilization.  The various methods of treatment have been discussed with the patient and family. After consideration of risks, benefits and other options for treatment, the patient has consented to  Procedure(s): LAPAROSCOPIC TUBAL LIGATION (Bilateral) as a surgical intervention.  The patient's history has been reviewed, patient examined, no change in status, stable for surgery.  I have reviewed the patient's chart and labs.  Questions were answered to the patient's satisfaction.     Christeen Douglas

## 2023-06-08 NOTE — Anesthesia Postprocedure Evaluation (Signed)
Anesthesia Post Note  Patient: Desiree Santos  Procedure(s) Performed: LAPAROSCOPIC TUBAL LIGATION (Bilateral: Abdomen)  Patient location during evaluation: PACU Anesthesia Type: General Level of consciousness: awake and alert Pain management: pain level controlled Vital Signs Assessment: post-procedure vital signs reviewed and stable Respiratory status: spontaneous breathing, nonlabored ventilation and respiratory function stable Cardiovascular status: blood pressure returned to baseline and stable Postop Assessment: no apparent nausea or vomiting Anesthetic complications: no   No notable events documented.   Last Vitals:  Vitals:   06/08/23 0930 06/08/23 0950  BP: (!) 160/94 (!) 140/76  Pulse: (!) 59 64  Resp: (!) 8 16  Temp:  36.4 C  SpO2:  94%    Last Pain:  Vitals:   06/08/23 0950  TempSrc: Temporal  PainSc: 0-No pain                 Foye Deer

## 2023-06-08 NOTE — Anesthesia Procedure Notes (Addendum)
Procedure Name: Intubation Date/Time: 06/08/2023 7:41 AM  Performed by: Joanette Gula, , CRNAPre-anesthesia Checklist: Patient identified, Emergency Drugs available, Suction available and Patient being monitored Patient Re-evaluated:Patient Re-evaluated prior to induction Oxygen Delivery Method: Circle system utilized Preoxygenation: Pre-oxygenation with 100% oxygen Induction Type: IV induction Ventilation: Mask ventilation without difficulty Laryngoscope Size: McGraph and 3 Grade View: Grade I Tube type: Oral Tube size: 6.5 mm Number of attempts: 1 Airway Equipment and Method: Stylet Placement Confirmation: ETT inserted through vocal cords under direct vision, positive ETCO2 and breath sounds checked- equal and bilateral Secured at: 19 cm Tube secured with: Tape Dental Injury: Teeth and Oropharynx as per pre-operative assessment

## 2023-06-08 NOTE — Transfer of Care (Signed)
Immediate Anesthesia Transfer of Care Note  Patient: Desiree Santos  Procedure(s) Performed: LAPAROSCOPIC TUBAL LIGATION (Bilateral: Abdomen)  Patient Location: PACU  Anesthesia Type:General  Level of Consciousness: drowsy  Airway & Oxygen Therapy: Patient Spontanous Breathing and Patient connected to face mask oxygen  Post-op Assessment: Report given to RN and Post -op Vital signs reviewed and stable  Post vital signs: Reviewed and stable  Last Vitals:  Vitals Value Taken Time  BP 175/97 06/08/23 0839  Temp    Pulse 61 06/08/23 0842  Resp 9 06/08/23 0842  SpO2 96   Vitals shown include unfiled device data.  Last Pain:  Vitals:   06/08/23 0633  TempSrc: Temporal  PainSc: 0-No pain         Complications: No notable events documented.

## 2023-06-08 NOTE — Discharge Instructions (Addendum)
Laparoscopic Tubal Ligation Discharge Instructions  Laparoscopic tubal ligation and fulguration ties your fallopian tubes to prevent pregnancy in the future.   For the next three days, take ibuprofen and acetaminophen on a schedule, every 8 hours. You can take them together or you can intersperse them, and take one every four hours. I also gave you gabapentin for nighttime, to help you sleep and also to control pain. Take gabapentin medicines at night for at least the next 3 nights. You also have a narcotic, oxycodone, to take as needed if the above medicines don't help.  Postop constipation is a major cause of pain. Stay well hydrated, walk as you tolerate, and take over the counter senna as well as stool softeners if you need them.  RISKS AND COMPLICATIONS  Infection. Bleeding. Injury to surrounding organs. Anesthetic side effects. Failure of the procedure. Risks of future ectopic pregnancy  PROCEDURE  You may be given a medicine to help you relax (sedative) before the procedure. You will be given a medicine to make you sleep (general anesthetic) during the procedure. A tube will be put down your throat to help your breath while under general anesthesia. Two small cuts (incisions) are made in the lower abdominal area and one incision is made near the belly button. Your abdominal area will be inflated with a safe gas (carbon dioxide). This helps give the surgeon room to operate, visualize, and helps the surgeon avoid other organs. A thin, lighted tube (laparoscope) with a camera attached is inserted into your abdomen through the incision near the belly button. Other small instruments may also be inserted through other abdominal incisions. The fallopian tube is located and are removed. After the fallopian tube is removed, the gas is released from the abdomen. The incisions will be closed with stitches (sutures), and Dermabond. A bandage may be placed over the incisions.  AFTER THE  PROCEDURE  You will also have some mild abdominal discomfort for 3-7 days. You will be given pain medicine to ease any discomfort. As long as there are no problems, you may be allowed to go home. Someone will need to drive you home and be with you for at least 24 hours once home. You may have some mild discomfort in the throat. This is from the tube placed in your throat while you were sleeping. You may experience discomfort in the shoulder area from some trapped air between the liver and diaphragm. This sensation is normal and will slowly go away on its own.  HOME CARE INSTRUCTIONS  Take all medicines as directed. Only take over-the-counter or prescription medicines for pain, discomfort, or fever as directed by your caregiver. Resume daily activities as directed. Showers are preferred over baths. You may resume sexual activities in 1 week or as directed. Do not drive while taking narcotics.  SEEK MEDICAL CARE IF: . There is increasing abdominal pain. You feel lightheaded or faint. You have the chills. You have an oral temperature above 102 F (38.9 C). There is pus-like (purulent) drainage from any of the wounds. You are unable to pass gas or have a bowel movement. You feel sick to your stomach (nauseous) or throw up (vomit).  MAKE SURE YOU:  Understand these instructions. Will watch your condition. Will get help right away if you are not doing well or get worse.  ExitCare Patient Information 2013 [employer]   AMBULATORY SURGERY  DISCHARGE INSTRUCTIONS   The drugs that you were given will stay in your system until tomorrow so   for the next 24 hours you should not:  Drive an automobile Make any legal decisions Drink any alcoholic beverage   You may resume regular meals tomorrow.  Today it is better to start with liquids and gradually work up to solid foods.  You may eat anything you prefer, but it is better to start with liquids, then soup and crackers, and gradually  work up to solid foods.   Please notify your doctor immediately if you have any unusual bleeding, trouble breathing, redness and pain at the surgery site, drainage, fever, or pain not relieved by medication.    Additional Instructions:   Please contact your physician with any problems or Same Day Surgery at [phone], Monday through Friday 6 am to 4 pm, or [facility] Main number at [phone].    

## 2023-06-08 NOTE — Anesthesia Preprocedure Evaluation (Addendum)
Anesthesia Evaluation  Patient identified by MRN, date of birth, ID band Patient awake    Reviewed: Allergy & Precautions, NPO status , Patient's Chart, lab work & pertinent test results  History of Anesthesia Complications Negative for: history of anesthetic complications  Airway Mallampati: I   Neck ROM: Full    Dental no notable dental hx.    Pulmonary former smoker (quit few years ago)   Pulmonary exam normal breath sounds clear to auscultation       Cardiovascular hypertension, Normal cardiovascular exam Rhythm:Regular Rate:Normal  ECG 12/03/22: normal   Neuro/Psych  Headaches PSYCHIATRIC DISORDERS  Depression       GI/Hepatic ,GERD  ,,  Endo/Other  diabetes, GestationalHypothyroidism  Obesity   Renal/GU Renal disease (nephrolithiasis)     Musculoskeletal   Abdominal   Peds  Hematology  (+) Blood dyscrasia, anemia   Anesthesia Other Findings   Reproductive/Obstetrics                             Anesthesia Physical Anesthesia Plan  ASA: 2  Anesthesia Plan: General   Post-op Pain Management:    Induction: Intravenous  PONV Risk Score and Plan: 3 and Ondansetron, Dexamethasone and Treatment may vary due to age or medical condition  Airway Management Planned: Oral ETT  Additional Equipment:   Intra-op Plan:   Post-operative Plan: Extubation in OR  Informed Consent: I have reviewed the patients History and Physical, chart, labs and discussed the procedure including the risks, benefits and alternatives for the proposed anesthesia with the patient or authorized representative who has indicated his/her understanding and acceptance.     Dental advisory given  Plan Discussed with: CRNA  Anesthesia Plan Comments: (Patient consented for risks of anesthesia including but not limited to:  - adverse reactions to medications - damage to eyes, teeth, lips or other oral mucosa -  nerve damage due to positioning  - sore throat or hoarseness - damage to heart, brain, nerves, lungs, other parts of body or loss of life  Informed patient about role of CRNA in peri- and intra-operative care.  Patient voiced understanding.)        Anesthesia Quick Evaluation

## 2023-06-11 NOTE — Progress Notes (Signed)
noted 

## 2023-09-16 ENCOUNTER — Encounter: Payer: Self-pay | Admitting: Family Medicine

## 2023-09-16 ENCOUNTER — Ambulatory Visit: Payer: Medicaid Other | Admitting: Family Medicine

## 2023-09-16 ENCOUNTER — Ambulatory Visit (INDEPENDENT_AMBULATORY_CARE_PROVIDER_SITE_OTHER)
Admission: RE | Admit: 2023-09-16 | Discharge: 2023-09-16 | Disposition: A | Discharge status: Home / Self Care | Payer: BC Managed Care – PPO | Source: Ambulatory Visit | Attending: Family Medicine | Admitting: Family Medicine

## 2023-09-16 VITALS — BP 142/89 | HR 70 | Temp 98.3°F | Ht 62.0 in | Wt 210.5 lb

## 2023-09-16 DIAGNOSIS — J189 Pneumonia, unspecified organism: Secondary | ICD-10-CM | POA: Insufficient documentation

## 2023-09-16 DIAGNOSIS — I10 Essential (primary) hypertension: Secondary | ICD-10-CM | POA: Diagnosis not present

## 2023-09-16 MED ORDER — ALBUTEROL SULFATE HFA 108 (90 BASE) MCG/ACT IN AERS: 2.0000 | INHALATION_SPRAY | Freq: Four times a day (QID) | RESPIRATORY_TRACT | 0 refills | Status: DC | PRN

## 2023-09-16 MED ORDER — PREDNISONE 20 MG PO TABS: ORAL_TABLET | ORAL | 0 refills | Status: DC

## 2023-09-16 MED ORDER — METHYLPREDNISOLONE ACETATE 40 MG/ML IJ SUSP
40.0000 mg | Freq: Once | INTRAMUSCULAR | Status: AC
Start: 2023-09-16 — End: 2023-09-16
  Administered 2023-09-16: 40 mg via INTRAMUSCULAR

## 2023-09-16 NOTE — Assessment & Plan Note (Addendum)
With ongoing cough and wheeze/tight chest (reactive airways)  Reviewed UC notes, lab, results, film report today in detail (was treatment with levaquin and 40 mg prednisone for 5 d and prometh dm On exam- tight /some wheeze noted Reassuring pulse ox 98%  Cxr ordered today and it is clear/no opacity / very reassuring  Work note given  Symptom care=see AVS  Prescription albuterol mdi for wheeze Depo medrol 40 mg IM now  Prednisone taper 60 mg to start tomorrow  Update if not starting to improve in a week or if worsening  Call back and Er precautions noted in detail today

## 2023-09-16 NOTE — Patient Instructions (Addendum)
Steroid shot today  Start the prednisone taper tomorrow  Continue other medicines Try the albuterol inhaler for wheezing and tightness   If symptoms get more severe -go to the ER  Drink lots of fluids and rest when you can  Watch your temp  Tylenol for fever/ chills and body aches is ok

## 2023-09-16 NOTE — Progress Notes (Signed)
Subjective:    Patient ID: Desiree Santos, female    DOB: 05-May-1986, 37 y.o.   MRN: [id number]  HPI  Wt Readings from Last 3 Encounters:  09/16/23 210 lb 8 oz (95.5 kg)  06/08/23 209 lb (94.8 kg)  04/28/23 230 lb (104.3 kg)   38.50 kg/m  Vitals:   09/16/23 0828 09/16/23 0855  BP: (!) 144/92 (!) 142/89  Pulse: 70   Temp: 98.3 F (36.8 C)   SpO2: 98%     37 yo pf of NP Dugal presents for cough / pneumonia   Seen in [facility] on 11/15 for uri symptoms (after having covid in late oct) Was dx with pna of LLL  Flu test neg , strep test neg  Treatment with levaquin 500 mg daily for 10 d Prednisone  bid for 5 d  Prometh dm  Also dx with bact uti  (culture with strep/ contamination)  Given xopenex nmt for bronchospasm   Cbc noted wbc 8.6 Hb 12.5  Having a lot of lung tightness and hard to breathe  Wheezing  Continues to cough- about the same   Hears chest congestion but not moving / feels very deep   Body aches  Some fever - not over 100  Ears are stopped up  Throat does not hurt any more   No n/v/d   Cxr today DG Chest 2 View  Result Date: 09/16/2023 CLINICAL DATA:  Cough, wheezing. EXAM: CHEST - 2 VIEW COMPARISON:  None Available. FINDINGS: The heart size and mediastinal contours are within normal limits. Both lungs are clear. The visualized skeletal structures are unremarkable. IMPRESSION: No active cardiopulmonary disease. Electronically Signed   By: Lupita Raider M.D.   On: 09/16/2023 12:06       Patient Active Problem List   Diagnosis Date Noted   Pneumonia involving left lung 09/16/2023   Chronic hypertension affecting pregnancy 04/28/2023   Decreased fetal movement 02/20/2023   [redacted] weeks gestation of pregnancy 10/28/2022   Supervision of high risk pregnancy in second trimester 09/25/2022   Supervision of high risk pregnancy in third trimester 09/25/2022   Primary hypertension 07/21/2022   Iron deficiency anemia 07/14/2022    Chronic migraine 02/05/2017   Nonintractable headache 02/03/2017   Morbid obesity with BMI of 40.0-44.9, adult (HCC) 11/05/2016   Hypothyroidism 09/24/2016   Gastroesophageal reflux disease without esophagitis 09/24/2016   HLD (hyperlipidemia) 09/24/2016   Past Medical History:  Diagnosis Date   Anemia    Chicken pox    COVID-19 05/26/2023   Depression    Elevated BP without diagnosis of hypertension    GERD (gastroesophageal reflux disease)    Gestational diabetes    Headache    History of kidney stones    History of UTI    Hypertension    Hypothyroidism    Thyroid disease    Past Surgical History:  Procedure Laterality Date   CHOLECYSTECTOMY     DILATION AND CURETTAGE OF UTERUS N/A 05/06/2023   Procedure: SUCTION DILATATION AND CURETTAGE;  Surgeon: Christeen Douglas, MD;  Location: [facility] ORS;  Service: Gynecology;  Laterality: N/A;   KIDNEY SURGERY  2002   h/o kidney reflux?   LAPAROSCOPIC TUBAL LIGATION Bilateral 06/08/2023   Procedure: LAPAROSCOPIC TUBAL LIGATION;  Surgeon: Christeen Douglas, MD;  Location: [facility] OR[facility];  Service: Gynecology;  Laterality: Bilateral;   TONSILLECTOMY     WISDOM TOOTH EXTRACTION Right 10/20/2022   Social History   Tobacco Use   Smoking status:  Former    Types: Cigarettes   Smokeless tobacco: Never  Vaping Use   Vaping status: Never Used  Substance Use Topics   Alcohol use: No   Drug use: No   Family History  Problem Relation Age of Onset   Hyperlipidemia Mother    Hypertension Mother    Heart disease Father    Sudden Cardiac Death Father        age 49   Arthritis Maternal Grandmother    Cancer Maternal Grandmother    Depression Maternal Grandmother    Hypertension Maternal Grandmother    Dementia Maternal Grandmother    Hypertension Maternal Grandfather    Early death Paternal Grandmother    Cancer Paternal Grandmother    Breast cancer Paternal Grandmother        thinks in her 54's   Diabetes Paternal Grandfather    Cancer  Paternal Grandfather    Hypertension Paternal Grandfather    Hyperlipidemia Paternal Grandfather    Heart disease Paternal Grandfather    Early death Paternal Grandfather    Depression Paternal Grandfather    No Known Allergies Current Outpatient Medications on File Prior to Visit  Medication Sig Dispense Refill   labetalol (NORMODYNE) 200 MG tablet Take 1 tablet by mouth 2 (two) times daily.     levofloxacin (LEVAQUIN) 500 MG tablet Take 500 mg by mouth daily.     levothyroxine (SYNTHROID) 100 MCG tablet Take one 100 mcg tablet along with one 88 mcg tablet for total of 188 mcg once daily. Do not take with food or other medications for at least thirty minutes. 30 tablet 1   levothyroxine (SYNTHROID) 75 MCG tablet Take 75 mcg by mouth at bedtime.     omeprazole (PRILOSEC) 40 MG capsule Take 40 mg by mouth daily.     promethazine (PHENERGAN) 6.25 MG/5ML solution Take 5 mLs by mouth every 6 (six) hours as needed for nausea or vomiting.     No current facility-administered medications on file prior to visit.    Review of Systems  Constitutional:  Positive for fatigue and fever. Negative for activity change, appetite change and unexpected weight change.  HENT:  Negative for congestion, ear pain, rhinorrhea, sinus pressure and sore throat.   Eyes:  Negative for pain, redness and visual disturbance.  Respiratory:  Positive for cough, shortness of breath and wheezing. Negative for stridor.   Cardiovascular:  Negative for chest pain and palpitations.  Gastrointestinal:  Negative for abdominal pain, blood in stool, constipation and diarrhea.  Endocrine: Negative for polydipsia and polyuria.  Genitourinary:  Negative for dysuria, frequency and urgency.  Musculoskeletal:  Negative for arthralgias, back pain and myalgias.  Skin:  Negative for pallor and rash.  Allergic/Immunologic: Negative for environmental allergies.  Neurological:  Negative for dizziness, syncope and headaches.  Hematological:   Negative for adenopathy. Does not bruise/bleed easily.  Psychiatric/Behavioral:  Negative for decreased concentration and dysphoric mood. The patient is not nervous/anxious.        Objective:   Physical Exam Constitutional:      General: She is not in acute distress.    Appearance: Normal appearance. She is well-developed. She is obese. She is not ill-appearing or diaphoretic.  HENT:     Head: Normocephalic and atraumatic.     Right Ear: Tympanic membrane and ear canal normal.     Left Ear: Ear canal normal.     Nose: Nose normal.     Mouth/Throat:     Mouth: Mucous membranes are moist.  Pharynx: Oropharynx is clear. No posterior oropharyngeal erythema.  Eyes:     General:        Right eye: No discharge.        Left eye: No discharge.     Conjunctiva/sclera: Conjunctivae normal.     Pupils: Pupils are equal, round, and reactive to light.  Neck:     Thyroid: No thyromegaly.     Vascular: No carotid bruit or JVD.  Cardiovascular:     Rate and Rhythm: Normal rate and regular rhythm.     Heart sounds: Normal heart sounds.     No gallop.  Pulmonary:     Effort: Pulmonary effort is normal. No respiratory distress.     Breath sounds: No stridor. Wheezing and rhonchi present. No rales.     Comments: Scattered rhonchi  End exp wheezing  Distant bs worse at bases   Abdominal:     General: There is no distension or abdominal bruit.     Palpations: Abdomen is soft.  Musculoskeletal:     Cervical back: Normal range of motion and neck supple.     Right lower leg: No edema.     Left lower leg: No edema.  Lymphadenopathy:     Cervical: No cervical adenopathy.  Skin:    General: Skin is warm and dry.     Coloration: Skin is not pale.     Findings: No rash.  Neurological:     Mental Status: She is alert.     Coordination: Coordination normal.     Deep Tendon Reflexes: Reflexes are normal and symmetric. Reflexes normal.  Psychiatric:        Mood and Affect: Mood normal.            Assessment & Plan:   Problem List Items Addressed This Visit       Cardiovascular and Mediastinum   Primary hypertension    Blood pressure up today possibly due to illness and also steroids  BP: (!) 142/89  Takes labetalol 200 mg bid    Will plan follow up with pcp in the next wk for re check         Respiratory   Pneumonia involving left lung - Primary    With ongoing cough and wheeze/tight chest (reactive airways)  Reviewed UC notes, lab, results, film report today in detail (was treatment with levaquin and 40 mg prednisone for 5 d and prometh dm On exam- tight /some wheeze noted Reassuring pulse ox 98%  Cxr ordered today and it is clear/no opacity / very reassuring  Work note given  Symptom care=see AVS  Prescription albuterol mdi for wheeze Depo medrol 40 mg IM now  Prednisone taper 60 mg to start tomorrow  Update if not starting to improve in a week or if worsening  Call back and Er precautions noted in detail today        Relevant Medications   promethazine (PHENERGAN) 6.25 MG/5ML solution   levofloxacin (LEVAQUIN) 500 MG tablet   albuterol (VENTOLIN HFA) 108 (90 Base) MCG/ACT inhaler   Other Relevant Orders   DG Chest 2 View (Completed)

## 2023-09-16 NOTE — Assessment & Plan Note (Signed)
Blood pressure up today possibly due to illness and also steroids  BP: (!) 142/89  Takes labetalol 200 mg bid    Will plan follow up with pcp in the next wk for re check

## 2023-09-22 ENCOUNTER — Encounter: Payer: Self-pay | Admitting: Family

## 2023-09-22 ENCOUNTER — Ambulatory Visit: Payer: BC Managed Care – PPO | Admitting: Family

## 2023-09-22 VITALS — BP 104/72 | HR 87 | Temp 98.1°F | Ht 62.0 in | Wt 214.6 lb

## 2023-09-22 DIAGNOSIS — J189 Pneumonia, unspecified organism: Secondary | ICD-10-CM | POA: Diagnosis not present

## 2023-09-22 DIAGNOSIS — F418 Other specified anxiety disorders: Secondary | ICD-10-CM

## 2023-09-22 DIAGNOSIS — R062 Wheezing: Secondary | ICD-10-CM | POA: Insufficient documentation

## 2023-09-22 DIAGNOSIS — E538 Deficiency of other specified B group vitamins: Secondary | ICD-10-CM

## 2023-09-22 DIAGNOSIS — E790 Hyperuricemia without signs of inflammatory arthritis and tophaceous disease: Secondary | ICD-10-CM | POA: Diagnosis not present

## 2023-09-22 DIAGNOSIS — J4521 Mild intermittent asthma with (acute) exacerbation: Secondary | ICD-10-CM

## 2023-09-22 DIAGNOSIS — E039 Hypothyroidism, unspecified: Secondary | ICD-10-CM

## 2023-09-22 DIAGNOSIS — R748 Abnormal levels of other serum enzymes: Secondary | ICD-10-CM | POA: Diagnosis not present

## 2023-09-22 DIAGNOSIS — J45909 Unspecified asthma, uncomplicated: Secondary | ICD-10-CM | POA: Insufficient documentation

## 2023-09-22 DIAGNOSIS — F53 Postpartum depression: Secondary | ICD-10-CM

## 2023-09-22 LAB — B12 AND FOLATE PANEL
Folate: 5.8 ng/mL — ABNORMAL LOW (ref >5.9)
Vitamin B-12: 256 pg/mL (ref 211–911)

## 2023-09-22 LAB — COMPREHENSIVE METABOLIC PANEL
ALT: 17 U/L (ref 0–35)
AST: 12 U/L (ref 0–37)
Albumin: 4.7 g/dL (ref 3.5–5.2)
Alkaline Phosphatase: 74 U/L (ref 39–117)
BUN: 17 mg/dL (ref 6–23)
CO2: 24 meq/L (ref 19–32)
Calcium: 10 mg/dL (ref 8.4–10.5)
Chloride: 100 meq/L (ref 96–112)
Creatinine, Ser: 0.68 mg/dL (ref 0.40–1.20)
GFR: 110.9 mL/min (ref >60.00)
Glucose, Bld: 88 mg/dL (ref 70–99)
Potassium: 4.4 meq/L (ref 3.5–5.1)
Sodium: 136 meq/L (ref 135–145)
Total Bilirubin: 0.5 mg/dL (ref 0.2–1.2)
Total Protein: 7.6 g/dL (ref 6.0–8.3)

## 2023-09-22 LAB — URIC ACID: Uric Acid, Serum: 8 mg/dL — ABNORMAL HIGH (ref 2.4–7.0)

## 2023-09-22 LAB — MAGNESIUM: Magnesium: 1.7 mg/dL (ref 1.5–2.5)

## 2023-09-22 LAB — TSH: TSH: 30.38 u[IU]/mL — ABNORMAL HIGH (ref 0.35–5.50)

## 2023-09-22 MED ORDER — FLUTICASONE-SALMETEROL 100-50 MCG/ACT IN AEPB
1.0000 | INHALATION_SPRAY | Freq: Two times a day (BID) | RESPIRATORY_TRACT | 0 refills | Status: DC
Start: 2023-09-22 — End: 2023-10-19

## 2023-09-22 MED ORDER — SERTRALINE HCL 50 MG PO TABS
50.0000 mg | ORAL_TABLET | Freq: Every day | ORAL | 0 refills | Status: DC
Start: 2023-09-22 — End: 2023-12-18

## 2023-09-22 NOTE — Progress Notes (Unsigned)
Established Patient Office Visit  Subjective:   Patient ID: Desiree Santos, female    DOB: Jul 01, 1986  Age: 37 y.o. MRN: [id number]  CC:  Chief Complaint  Patient presents with   Follow-up    Recheck lungs    HPI: Desiree Santos is a 37 y.o. female presenting on 09/22/2023 for Follow-up (Recheck lungs)  11/15 dx with Pneumonia left lung, given rx for levaquin and 40 mg prednisone x 5 days with prometh dm, returned to our office 11/20 sent home with albuterol prn  Given depo medrol in office and prednisone taper 60 mg   Cxr reviewed from 11/20 unremarkable.   Today comes in and feeling much better, wheezing slight but much improved. No sob. Still with residual cough non productive. She has to still use her albuterol inhaler at least twice daily.     Depression, at times just wants to walk out of the house and get away from everything. Felt this way after her previous pregnancy. Infant born July 2nd. No SI or HI. Feels fatigued all of the time, in bed more often but unable to sleep as with newborn. Sleeps at night when he sleeps. Also with increased anxiety.  Was on prozac in the past.      ROS: Negative unless specifically indicated above in HPI.   Relevant past medical history reviewed and updated as indicated.   Allergies and medications reviewed and updated.   Current Outpatient Medications:    fluticasone-salmeterol (ADVAIR) 100-50 MCG/ACT AEPB, Inhale 1 puff into the lungs 2 (two) times daily., Disp: 1 each, Rfl: 0   sertraline (ZOLOFT) 50 MG tablet, Take 1 tablet (50 mg total) by mouth daily., Disp: 90 tablet, Rfl: 0   albuterol (VENTOLIN HFA) 108 (90 Base) MCG/ACT inhaler, Inhale 2 puffs into the lungs every 6 (six) hours as needed for wheezing or shortness of breath., Disp: 8 g, Rfl: 0   labetalol (NORMODYNE) 200 MG tablet, Take 1 tablet by mouth 2 (two) times daily., Disp: , Rfl:    levothyroxine (SYNTHROID) 100 MCG tablet, Take one 100 mcg tablet along with  one 88 mcg tablet for total of 188 mcg once daily. Do not take with food or other medications for at least thirty minutes., Disp: 30 tablet, Rfl: 1   levothyroxine (SYNTHROID) 75 MCG tablet, Take 75 mcg by mouth at bedtime., Disp: , Rfl:    omeprazole (PRILOSEC) 40 MG capsule, Take 40 mg by mouth daily., Disp: , Rfl:    predniSONE (DELTASONE) 20 MG tablet, Take 3 pills once daily by mouth for 3 days, then 2 pills once daily for 3 days, then 1 pill once daily for 3 days and then stop, Disp: 18 tablet, Rfl: 0   promethazine (PHENERGAN) 6.25 MG/5ML solution, Take 5 mLs by mouth every 6 (six) hours as needed for nausea or vomiting., Disp: , Rfl:   No Known Allergies  Objective:   BP 104/72 (BP Location: Left Arm, Patient Position: Sitting, Cuff Size: Normal)   Pulse 87   Temp 98.1 F (36.7 C) (Temporal)   Ht 5\' 2"  (1.575 m)   Wt 214 lb 9.6 oz (97.3 kg)   LMP 08/17/2023 Comment: Pt. states no chance of pregnancy  SpO2 97%   BMI 39.25 kg/m    Physical Exam Constitutional:      General: She is not in acute distress.    Appearance: Normal appearance. She is normal weight. She is not ill-appearing, toxic-appearing or diaphoretic.  HENT:  Head: Normocephalic.     Right Ear: Tympanic membrane normal.     Left Ear: Tympanic membrane normal.     Nose: Nose normal.     Mouth/Throat:     Mouth: Mucous membranes are dry.     Pharynx: No oropharyngeal exudate or posterior oropharyngeal erythema.  Eyes:     Extraocular Movements: Extraocular movements intact.     Pupils: Pupils are equal, round, and reactive to light.  Cardiovascular:     Rate and Rhythm: Normal rate and regular rhythm.     Pulses: Normal pulses.     Heart sounds: Normal heart sounds.  Pulmonary:     Effort: Pulmonary effort is normal.     Breath sounds: Examination of the right-middle field reveals wheezing. Examination of the right-lower field reveals wheezing. Wheezing present.  Musculoskeletal:     Cervical back:  Normal range of motion.  Neurological:     General: No focal deficit present.     Mental Status: She is alert and oriented to person, place, and time. Mental status is at baseline.  Psychiatric:        Mood and Affect: Mood normal.        Behavior: Behavior normal.        Thought Content: Thought content normal.        Judgment: Judgment normal.     Assessment & Plan:  Pneumonia of left lower lobe due to infectious organism Assessment & Plan: Resolution of symptoms and also confirmation with CXR 11/20  Advised cough may last for a few weeks but if sx worsen or fail to improve need to f/u    Vitamin B12 deficiency -     B12 and Folate Panel  Hypomagnesemia -     Magnesium  Elevated alkaline phosphatase level -     Comprehensive metabolic panel  Acquired hypothyroidism -     TSH  Elevated blood uric acid level -     Uric acid  Depression with anxiety -     Sertraline HCl; Take 1 tablet (50 mg total) by mouth daily.  Dispense: 90 tablet; Refill: 0  Postpartum depression -     Sertraline HCl; Take 1 tablet (50 mg total) by mouth daily.  Dispense: 90 tablet; Refill: 0  Mild intermittent reactive airway disease with acute exacerbation -     Fluticasone-Salmeterol; Inhale 1 puff into the lungs 2 (two) times daily.  Dispense: 1 each; Refill: 0  Wheezing     Follow up plan: Return in about 2 weeks (around 10/06/2023) for f/u anxiety, f/u depression.  Mort Sawyers, FNP

## 2023-09-22 NOTE — Assessment & Plan Note (Signed)
Symptoms slowly resolving, pneumonia  resolved per CXR 11/20  Advised cough may last for a few weeks but if sx worsen or fail to improve need to f/u  For exacerbation reactive airway disease will give advair 1 puff bid x two weeks and then close f/u in office. Given red flag symptoms

## 2023-09-22 NOTE — Patient Instructions (Signed)
------------------------------------    Start sertraline 50 mg for anxiety and depression. Take 1/2 tablet by mouth once daily for about one week, then increase to 1 full tablet thereafter.   Taking the medicine as directed and not missing any doses is one of the best things you can do to treat your anxiety/depression.  Here are some things to keep in mind:  Side effects (stomach upset, some increased anxiety) may happen before you notice a benefit.  These side effects typically go away over time. Changes to your dose of medicine or a change in medication all together is sometimes necessary Many people will notice an improvement within two weeks but the full effect of the medication can take up to 4-6 weeks Stopping the medication when you start feeling better often results in a return of symptoms. Most people need to be on medication at least 6-12 months If you start having thoughts of hurting yourself or others after starting this medicine, please call me immediately.    ------------------------------------

## 2023-09-23 ENCOUNTER — Other Ambulatory Visit: Payer: Self-pay | Admitting: Family

## 2023-09-23 DIAGNOSIS — F418 Other specified anxiety disorders: Secondary | ICD-10-CM | POA: Insufficient documentation

## 2023-09-23 DIAGNOSIS — E538 Deficiency of other specified B group vitamins: Secondary | ICD-10-CM

## 2023-09-23 DIAGNOSIS — E039 Hypothyroidism, unspecified: Secondary | ICD-10-CM

## 2023-09-23 MED ORDER — FOLIC ACID 1 MG PO TABS
2.0000 mg | ORAL_TABLET | Freq: Every day | ORAL | 3 refills | Status: DC
Start: 2023-09-23 — End: 2024-08-22

## 2023-09-23 NOTE — Progress Notes (Signed)
Please call.   Folate and b12 on the lower end of normal.  Are you taking over the counter B12? If no start 1000 mcg once daily.  I will send in RX for folic acid once daily.   Thyroid is very much elevated at 30 from 26. Are you taking the leovthyroxine and if you are what is the total dosage you are taking? Are you taking it am in prior to food and or water by at least 30 minutes and at least 4 hours from vitamins and or acid reducing medications?  Uric acid also elevated at 8.0. has she ever had kidney stones and or gout attacks? I will see her again 12/12 to see how things are going.  Pending thyroid response will likely adjust dosages and consider thyroid u/s

## 2023-09-23 NOTE — Assessment & Plan Note (Signed)
Start sertraline 50 mg once daily.  Recommendation for therapy

## 2023-10-02 ENCOUNTER — Encounter: Payer: Self-pay | Admitting: *Deleted

## 2023-10-05 ENCOUNTER — Other Ambulatory Visit: Payer: Self-pay

## 2023-10-05 ENCOUNTER — Emergency Department
Admission: EM | Admit: 2023-10-05 | Discharge: 2023-10-05 | Disposition: A | Discharge status: Home / Self Care | Payer: BC Managed Care – PPO | Attending: Emergency Medicine | Admitting: Emergency Medicine

## 2023-10-05 DIAGNOSIS — R112 Nausea with vomiting, unspecified: Secondary | ICD-10-CM | POA: Diagnosis present

## 2023-10-05 DIAGNOSIS — A084 Viral intestinal infection, unspecified: Secondary | ICD-10-CM | POA: Insufficient documentation

## 2023-10-05 DIAGNOSIS — Z1152 Encounter for screening for COVID-19: Secondary | ICD-10-CM | POA: Insufficient documentation

## 2023-10-05 LAB — PREGNANCY, URINE: Preg Test, Ur: NEGATIVE

## 2023-10-05 LAB — CBC
HCT: 39.7 % (ref 36.0–46.0)
Hemoglobin: 13.5 g/dL (ref 12.0–15.0)
MCH: 28.5 pg (ref 26.0–34.0)
MCHC: 34 g/dL (ref 30.0–36.0)
MCV: 83.8 fL (ref 80.0–100.0)
Platelets: 240 10*3/uL (ref 150–400)
RBC: 4.74 MIL/uL (ref 3.87–5.11)
RDW: 15.3 % (ref 11.5–15.5)
WBC: 8.9 10*3/uL (ref 4.0–10.5)
nRBC: 0 % (ref 0.0–0.2)

## 2023-10-05 LAB — RESP PANEL BY RT-PCR (RSV, FLU A&B, COVID)  RVPGX2
Influenza A by PCR: NEGATIVE
Influenza B by PCR: NEGATIVE
Resp Syncytial Virus by PCR: NEGATIVE
SARS Coronavirus 2 by RT PCR: NEGATIVE

## 2023-10-05 LAB — URINALYSIS, ROUTINE W REFLEX MICROSCOPIC
Bilirubin Urine: NEGATIVE
Glucose, UA: NEGATIVE mg/dL
Ketones, ur: NEGATIVE mg/dL
Leukocytes,Ua: NEGATIVE
Nitrite: NEGATIVE
Protein, ur: 100 mg/dL — AB
Specific Gravity, Urine: >1.03 — ABNORMAL HIGH (ref 1.005–1.030)
pH: 5 (ref 5.0–8.0)

## 2023-10-05 LAB — COMPREHENSIVE METABOLIC PANEL
ALT: 38 U/L (ref 0–44)
AST: 34 U/L (ref 15–41)
Albumin: 4.1 g/dL (ref 3.5–5.0)
Alkaline Phosphatase: 60 U/L (ref 38–126)
Anion gap: 15 (ref 5–15)
BUN: 9 mg/dL (ref 6–20)
CO2: 18 mmol/L — ABNORMAL LOW (ref 22–32)
Calcium: 8.9 mg/dL (ref 8.9–10.3)
Chloride: 102 mmol/L (ref 98–111)
Creatinine, Ser: 0.71 mg/dL (ref 0.44–1.00)
GFR, Estimated: >60 mL/min (ref >60)
Glucose, Bld: 108 mg/dL — ABNORMAL HIGH (ref 70–99)
Potassium: 3.5 mmol/L (ref 3.5–5.1)
Sodium: 135 mmol/L (ref 135–145)
Total Bilirubin: 1.5 mg/dL — ABNORMAL HIGH (ref <1.2)
Total Protein: 7.3 g/dL (ref 6.5–8.1)

## 2023-10-05 LAB — URINALYSIS, MICROSCOPIC (REFLEX): Squamous Epithelial / HPF: >50 /[HPF] (ref 0–5)

## 2023-10-05 LAB — LIPASE, BLOOD: Lipase: 21 U/L (ref 11–51)

## 2023-10-05 MED ORDER — SODIUM CHLORIDE 0.9 % IV BOLUS
500.0000 mL | Freq: Once | INTRAVENOUS | Status: AC
  Administered 2023-10-05: 500 mL via INTRAVENOUS

## 2023-10-05 MED ORDER — ONDANSETRON HCL 4 MG/2ML IJ SOLN
4.0000 mg | Freq: Once | INTRAMUSCULAR | Status: AC
  Administered 2023-10-05: 4 mg via INTRAVENOUS
  Filled 2023-10-05: qty 2

## 2023-10-05 MED ORDER — KETOROLAC TROMETHAMINE 30 MG/ML IJ SOLN
30.0000 mg | Freq: Once | INTRAMUSCULAR | Status: AC
  Administered 2023-10-05: 30 mg via INTRAVENOUS
  Filled 2023-10-05: qty 1

## 2023-10-05 MED ORDER — ONDANSETRON 4 MG PO TBDP: 4.0000 mg | ORAL_TABLET | Freq: Three times a day (TID) | ORAL | 0 refills | Status: DC | PRN

## 2023-10-05 NOTE — ED Notes (Signed)
See triage note  Presents with some body aches ,abd pai  with n/v  Low grade temp on arrival

## 2023-10-05 NOTE — ED Triage Notes (Signed)
Pt comes with c/o severe vomiting, nausea and body aches for a day. Pt states hx of pneumonia last week.

## 2023-10-05 NOTE — ED Provider Notes (Signed)
[facility] Provider Note    Event Date/Time   First MD Initiated Contact with Patient 10/05/23 [id number]     (approximate)   History   Abdominal Pain   HPI  Desiree Santos is a 37 y.o. female who presents with complaints of nausea vomiting diarrhea abdominal cramping, body aches over the last 24 hours.  Denies sick contacts.  No recent travel.  No significant abdominal pain at this time     Physical Exam   Triage Vital Signs: ED Triage Vitals  Encounter Vitals Group     BP 10/05/23 0838 125/86     Systolic BP Percentile --      Diastolic BP Percentile --      Pulse Rate 10/05/23 0838 (!) 124     Resp 10/05/23 0838 19     Temp 10/05/23 0838 100 F (37.8 C)     Temp Source 10/05/23 0838 Oral     SpO2 10/05/23 0838 96 %     Weight 10/05/23 0836 97.3 kg (214 lb 9.6 oz)     Height 10/05/23 0836 1.575 m (5\' 2" )     Head Circumference --      Peak Flow --      Pain Score 10/05/23 0835 7     Pain Loc --      Pain Education --      Exclude from Growth Chart --     Most recent vital signs: Vitals:   10/05/23 0838  BP: 125/86  Pulse: (!) 124  Resp: 19  Temp: 100 F (37.8 C)  SpO2: 96%     General: Awake, no distress.  CV:  Good peripheral perfusion.  Resp:  Normal effort.  Abd:  No distention.  Soft, nontender, reassuring exam Other:     ED Results / Procedures / Treatments   Labs (all labs ordered are listed, but only abnormal results are displayed) Labs Reviewed  COMPREHENSIVE METABOLIC PANEL - Abnormal; Notable for the following components:      Result Value   CO2 18 (*)    Glucose, Bld 108 (*)    Total Bilirubin 1.5 (*)    All other components within normal limits  URINALYSIS, ROUTINE W REFLEX MICROSCOPIC - Abnormal; Notable for the following components:   Specific Gravity, Urine >1.030 (*)    Hgb urine dipstick TRACE (*)    Protein, ur 100 (*)    All other components within normal limits  URINALYSIS, MICROSCOPIC (REFLEX)  - Abnormal; Notable for the following components:   Bacteria, UA FEW (*)    All other components within normal limits  RESP PANEL BY RT-PCR (RSV, FLU A&B, COVID)  RVPGX2  LIPASE, BLOOD  CBC  PREGNANCY, URINE     EKG     RADIOLOGY     PROCEDURES:  Critical Care performed:   Procedures   MEDICATIONS ORDERED IN ED: Medications  sodium chloride 0.9 % bolus 500 mL (0 mLs Intravenous Stopped 10/05/23 1038)  ondansetron (ZOFRAN) injection 4 mg (4 mg Intravenous Given 10/05/23 0939)  ketorolac (TORADOL) 30 MG/ML injection 30 mg (30 mg Intravenous Given 10/05/23 0939)     IMPRESSION / MDM / ASSESSMENT AND PLAN / ED COURSE  I reviewed the triage vital signs and the nursing notes. Patient's presentation is most consistent with acute presentation with potential threat to life or bodily function.  Patient presents with nausea vomiting abdominal pain, diarrhea as detailed above, suspicious for viral gastroenteritis, no abdominal tenderness on exam.  Will treat with IV fluids, obtain labs, treat with IV Zofran and reevaluate.  Patient feeling better after IV fluid, IV Toradol.  Lab work reviewed and is overall reassuring, no indication for admission at this time, appropriate for discharge with supportive care, outpatient follow-up, return precautions discussed, she and her mother agree with this plan      FINAL CLINICAL IMPRESSION(S) / ED DIAGNOSES   Final diagnoses:  Viral gastroenteritis     Rx / DC Orders   ED Discharge Orders          Ordered    ondansetron (ZOFRAN-ODT) 4 MG disintegrating tablet  Every 8 hours PRN        10/05/23 1024             Note:  This document was prepared using Dragon voice recognition software and may include unintentional dictation errors.   Jene Every, MD 10/05/23 1200

## 2023-10-08 ENCOUNTER — Ambulatory Visit
Admission: RE | Admit: 2023-10-08 | Discharge: 2023-10-08 | Disposition: A | Discharge status: Home / Self Care | Payer: BC Managed Care – PPO | Source: Ambulatory Visit | Attending: Family | Admitting: Family

## 2023-10-08 ENCOUNTER — Other Ambulatory Visit: Payer: Self-pay | Admitting: Family Medicine

## 2023-10-08 ENCOUNTER — Other Ambulatory Visit: Payer: Self-pay | Admitting: Family

## 2023-10-08 ENCOUNTER — Encounter: Payer: Self-pay | Admitting: Family

## 2023-10-08 ENCOUNTER — Other Ambulatory Visit: Payer: BC Managed Care – PPO

## 2023-10-08 ENCOUNTER — Ambulatory Visit: Payer: BC Managed Care – PPO | Admitting: Family

## 2023-10-08 VITALS — BP 110/62 | HR 78 | Temp 98.6°F | Ht 62.0 in

## 2023-10-08 DIAGNOSIS — R809 Proteinuria, unspecified: Secondary | ICD-10-CM | POA: Insufficient documentation

## 2023-10-08 DIAGNOSIS — R300 Dysuria: Secondary | ICD-10-CM | POA: Diagnosis not present

## 2023-10-08 DIAGNOSIS — E876 Hypokalemia: Secondary | ICD-10-CM

## 2023-10-08 DIAGNOSIS — R197 Diarrhea, unspecified: Secondary | ICD-10-CM | POA: Diagnosis present

## 2023-10-08 DIAGNOSIS — R1011 Right upper quadrant pain: Secondary | ICD-10-CM | POA: Insufficient documentation

## 2023-10-08 DIAGNOSIS — R7309 Other abnormal glucose: Secondary | ICD-10-CM

## 2023-10-08 DIAGNOSIS — M545 Low back pain, unspecified: Secondary | ICD-10-CM

## 2023-10-08 DIAGNOSIS — R112 Nausea with vomiting, unspecified: Secondary | ICD-10-CM | POA: Insufficient documentation

## 2023-10-08 DIAGNOSIS — F418 Other specified anxiety disorders: Secondary | ICD-10-CM

## 2023-10-08 LAB — COMPREHENSIVE METABOLIC PANEL
ALT: 73 U/L — ABNORMAL HIGH (ref 0–35)
AST: 19 U/L (ref 0–37)
Albumin: 4 g/dL (ref 3.5–5.2)
Alkaline Phosphatase: 57 U/L (ref 39–117)
BUN: 9 mg/dL (ref 6–23)
CO2: 26 meq/L (ref 19–32)
Calcium: 8.9 mg/dL (ref 8.4–10.5)
Chloride: 104 meq/L (ref 96–112)
Creatinine, Ser: 0.66 mg/dL (ref 0.40–1.20)
GFR: 111.67 mL/min (ref >60.00)
Glucose, Bld: 116 mg/dL — ABNORMAL HIGH (ref 70–99)
Potassium: 3.1 meq/L — ABNORMAL LOW (ref 3.5–5.1)
Sodium: 138 meq/L (ref 135–145)
Total Bilirubin: 0.4 mg/dL (ref 0.2–1.2)
Total Protein: 6.7 g/dL (ref 6.0–8.3)

## 2023-10-08 LAB — CBC WITH DIFFERENTIAL/PLATELET
Basophils Absolute: 0 10*3/uL (ref 0.0–0.1)
Basophils Relative: 0.7 % (ref 0.0–3.0)
Eosinophils Absolute: 0.3 10*3/uL (ref 0.0–0.7)
Eosinophils Relative: 3.9 % (ref 0.0–5.0)
HCT: 36 % (ref 36.0–46.0)
Hemoglobin: 12.2 g/dL (ref 12.0–15.0)
Lymphocytes Relative: 25.8 % (ref 12.0–46.0)
Lymphs Abs: 1.7 10*3/uL (ref 0.7–4.0)
MCHC: 34 g/dL (ref 30.0–36.0)
MCV: 85.1 fL (ref 78.0–100.0)
Monocytes Absolute: 0.3 10*3/uL (ref 0.1–1.0)
Monocytes Relative: 4.5 % (ref 3.0–12.0)
Neutro Abs: 4.3 10*3/uL (ref 1.4–7.7)
Neutrophils Relative %: 65.1 % (ref 43.0–77.0)
Platelets: 273 10*3/uL (ref 150.0–400.0)
RBC: 4.23 Mil/uL (ref 3.87–5.11)
RDW: 16.6 % — ABNORMAL HIGH (ref 11.5–15.5)
WBC: 6.5 10*3/uL (ref 4.0–10.5)

## 2023-10-08 LAB — POC URINALSYSI DIPSTICK (AUTOMATED)
Bilirubin, UA: NEGATIVE
Blood, UA: NEGATIVE
Glucose, UA: NEGATIVE
Ketones, UA: NEGATIVE
Nitrite, UA: NEGATIVE
Protein, UA: POSITIVE — AB
Spec Grav, UA: >=1.03 — AB (ref 1.010–1.025)
Urobilinogen, UA: 0.2 U/dL
pH, UA: 5 (ref 5.0–8.0)

## 2023-10-08 LAB — AMYLASE: Amylase: 18 U/L — ABNORMAL LOW (ref 27–131)

## 2023-10-08 LAB — LIPASE: Lipase: 14 U/L (ref 11.0–59.0)

## 2023-10-08 LAB — HEMOGLOBIN A1C: Hgb A1c MFr Bld: 5.6 % (ref 4.6–6.5)

## 2023-10-08 MED ORDER — POTASSIUM CHLORIDE ER 10 MEQ PO CPCR: 10.0000 meq | ORAL_CAPSULE | Freq: Two times a day (BID) | ORAL | 0 refills | Status: DC

## 2023-10-08 MED ORDER — IOHEXOL 300 MG/ML  SOLN
100.0000 mL | Freq: Once | INTRAMUSCULAR | Status: AC | PRN
  Administered 2023-10-08: 100 mL via INTRAVENOUS

## 2023-10-08 NOTE — Progress Notes (Signed)
Established Patient Office Visit  Subjective:      CC:  Chief Complaint  Patient presents with   Depression    HPI: Desiree Santos is a 37 y.o. female presenting on 10/08/2023 for Depression . Here today for f/u   New complaints: Seen at [facility] 12/9 for n/v/diarrhea abd cramping and body aches. Labs unremarkable. Dx viral gastroenteritis. She states that sx had started four days ago, when she went to the ER. She is not able to keep down water, can not eat anything. Still with diarrhea and vomiting, has already had four bouts of diarrhea today, not seeing any blood in the stool. She is using toilet paper. Has not vomited yet today, she did however yesterday threw up four times. She does take zofran which helps some. Trying to get down ginger ale, and drinking almost a whole gallon of water daily. When she went to the ER she could visually see blood, not been checking the last few days. Burning with peeing and also low back pain. No vaginal discharge. At times with sharp stabbing back pain all the way across.   11/26 seen in office with me for pneumonia left lung, completed levaquin and 40 mg prednisone x 5 days, then given again 11/20 depo medrol in office and prednisone taper. Cxr 11/20 unremarkable. Still with slight sob and wheezing, I gave her RX for advair   Depression: last visit started on sertraline 50 mg once daily she is tolerating well without side effects. She is doing ok but she states has not yet seen any changes. Denies SI HI   Wt Readings from Last 3 Encounters:  10/05/23 214 lb 9.6 oz (97.3 kg)  09/22/23 214 lb 9.6 oz (97.3 kg)  09/16/23 210 lb 8 oz (95.5 kg)   Temp Readings from Last 3 Encounters:  10/08/23 98.6 F (37 C) (Oral)  10/05/23 100 F (37.8 C) (Oral)  09/22/23 98.1 F (36.7 C) (Temporal)   BP Readings from Last 3 Encounters:  10/08/23 110/62  10/05/23 125/86  09/22/23 104/72   Pulse Readings from Last 3 Encounters:  10/08/23 78  10/05/23  (!) 124  09/22/23 87     Social history:  Relevant past medical, surgical, family and social history reviewed and updated as indicated. Interim medical history since our last visit reviewed.  Allergies and medications reviewed and updated.  DATA REVIEWED: CHART IN EPIC     ROS: Negative unless specifically indicated above in HPI.    Current Outpatient Medications:    albuterol (VENTOLIN HFA) 108 (90 Base) MCG/ACT inhaler, Inhale 2 puffs into the lungs every 6 (six) hours as needed for wheezing or shortness of breath., Disp: 8 g, Rfl: 0   fluticasone-salmeterol (ADVAIR) 100-50 MCG/ACT AEPB, Inhale 1 puff into the lungs 2 (two) times daily., Disp: 1 each, Rfl: 0   folic acid (FOLVITE) 1 MG tablet, Take 2 tablets (2 mg total) by mouth daily., Disp: 180 tablet, Rfl: 3   levothyroxine (SYNTHROID) 100 MCG tablet, Take one 100 mcg tablet along with one 88 mcg tablet for total of 188 mcg once daily. Do not take with food or other medications for at least thirty minutes., Disp: 30 tablet, Rfl: 1   levothyroxine (SYNTHROID) 75 MCG tablet, Take 75 mcg by mouth at bedtime., Disp: , Rfl:    omeprazole (PRILOSEC) 40 MG capsule, Take 40 mg by mouth daily., Disp: , Rfl:    ondansetron (ZOFRAN-ODT) 4 MG disintegrating tablet, Take 1 tablet (4 mg  total) by mouth every 8 (eight) hours as needed for nausea or vomiting., Disp: 20 tablet, Rfl: 0   promethazine (PHENERGAN) 6.25 MG/5ML solution, Take 5 mLs by mouth every 6 (six) hours as needed for nausea or vomiting., Disp: , Rfl:    sertraline (ZOLOFT) 50 MG tablet, Take 1 tablet (50 mg total) by mouth daily., Disp: 90 tablet, Rfl: 0   labetalol (NORMODYNE) 200 MG tablet, Take 1 tablet by mouth 2 (two) times daily., Disp: , Rfl:       Objective:    BP 110/62   Pulse 78   Temp 98.6 F (37 C) (Oral)   Ht 5\' 2"  (1.575 m)   LMP 08/17/2023 Comment: Pt. states no chance of pregnancy  SpO2 100%   BMI 39.25 kg/m   Wt Readings from Last 3 Encounters:   10/05/23 214 lb 9.6 oz (97.3 kg)  09/22/23 214 lb 9.6 oz (97.3 kg)  09/16/23 210 lb 8 oz (95.5 kg)    Physical Exam Constitutional:      General: She is not in acute distress.    Appearance: Normal appearance. She is normal weight. She is not ill-appearing, toxic-appearing or diaphoretic.  HENT:     Head: Normocephalic.  Cardiovascular:     Rate and Rhythm: Normal rate and regular rhythm.  Pulmonary:     Effort: Pulmonary effort is normal.     Breath sounds: Normal breath sounds.  Abdominal:     General: Abdomen is flat. Bowel sounds are decreased.     Palpations: Abdomen is soft.     Tenderness: There is abdominal tenderness in the right upper quadrant and epigastric area. Negative signs include Murphy's sign and McBurney's sign.     Hernia: No hernia is present.  Musculoskeletal:        General: Normal range of motion.  Neurological:     General: No focal deficit present.     Mental Status: She is alert and oriented to person, place, and time. Mental status is at baseline.  Psychiatric:        Mood and Affect: Mood normal.        Behavior: Behavior normal.        Thought Content: Thought content normal.        Judgment: Judgment normal.           Assessment & Plan:  Dysuria -     POCT Urinalysis Dipstick (Automated) -     CBC with Differential/Platelet  Proteinuria, unspecified type -     CT ABDOMEN PELVIS W CONTRAST; Future -     Comprehensive metabolic panel  Nausea vomiting and diarrhea Assessment & Plan: Zofran and phenergan prn  Bland diet to follow    Orders: -     CBC with Differential/Platelet -     CT ABDOMEN PELVIS W CONTRAST; Future -     Comprehensive metabolic panel  Right upper quadrant abdominal pain Assessment & Plan: Stat ct abd pelvis Ddx cholecystitis, pancreatitis (low suspicion) and attempt to visualize for kidney stone of CT for some sharp stabbing low back pain.  Advised pt of red flag symptoms and when to pursue more urgent  care.  Stat cbc cmp lipase amylase urine panel pending results.   Orders: -     CT ABDOMEN PELVIS W CONTRAST; Future -     Amylase -     Lipase  Acute midline low back pain without sciatica -     CT ABDOMEN PELVIS W CONTRAST; Future  Diarrhea of presumed infectious origin Assessment & Plan: Suspected to be GI virus however ongoing, not improving.  Stool cultures ordered  Did advise pt to give a few days as could still be viral but if no improvement in 1-2 days submit stool cultures.   Pending results of CT abd r/o obstruction- pt can start immodium if no obstruction   Orders: -     Giardia antigen -     C. difficile GDH and Toxin A/B -     Gastrointestinal Pathogen Pnl RT, PCR  Depression with anxiety Assessment & Plan: No change however not worsening  Denies si hi  Cont sertraline Low suspicion however sertraline can cause diarrhea so if w/u negative and ongoing consider d/c       Return in about 1 month (around 11/08/2023) for f/u depression.  Mort Sawyers, MSN, APRN, FNP-C [facility]

## 2023-10-08 NOTE — Progress Notes (Signed)
Can we add on hga1c? Order in.

## 2023-10-08 NOTE — Assessment & Plan Note (Signed)
Suspected to be GI virus however ongoing, not improving.  Stool cultures ordered  Did advise pt to give a few days as could still be viral but if no improvement in 1-2 days submit stool cultures.   Pending results of CT abd r/o obstruction- pt can start immodium if no obstruction

## 2023-10-08 NOTE — Assessment & Plan Note (Signed)
No change however not worsening  Denies si hi  Cont sertraline Low suspicion however sertraline can cause diarrhea so if w/u negative and ongoing consider d/c

## 2023-10-08 NOTE — Addendum Note (Signed)
Addended by: Vincenza Hews on: 10/08/2023 01:03 PM   Modules accepted: Orders

## 2023-10-08 NOTE — Assessment & Plan Note (Signed)
Zofran and phenergan prn  Bland diet to follow

## 2023-10-08 NOTE — Assessment & Plan Note (Signed)
Stat ct abd pelvis Ddx cholecystitis, pancreatitis (low suspicion) and attempt to visualize for kidney stone of CT for some sharp stabbing low back pain.  Advised pt of red flag symptoms and when to pursue more urgent care.  Stat cbc cmp lipase amylase urine panel pending results.

## 2023-10-08 NOTE — Addendum Note (Signed)
Addended by: Jaynee Eagles C on: 10/08/2023 01:57 PM   Modules accepted: Orders

## 2023-10-09 ENCOUNTER — Other Ambulatory Visit: Payer: Self-pay

## 2023-10-09 ENCOUNTER — Other Ambulatory Visit: Payer: Self-pay | Admitting: Family

## 2023-10-09 DIAGNOSIS — R197 Diarrhea, unspecified: Secondary | ICD-10-CM

## 2023-10-09 DIAGNOSIS — R9389 Abnormal findings on diagnostic imaging of other specified body structures: Secondary | ICD-10-CM

## 2023-10-09 LAB — URINE CULTURE
MICRO NUMBER:: 15843207
SPECIMEN QUALITY:: ADEQUATE

## 2023-10-11 ENCOUNTER — Other Ambulatory Visit: Payer: Self-pay | Admitting: Family

## 2023-10-11 DIAGNOSIS — A045 Campylobacter enteritis: Secondary | ICD-10-CM

## 2023-10-11 MED ORDER — AZITHROMYCIN 500 MG PO TABS: ORAL_TABLET | ORAL | 0 refills | Status: DC

## 2023-10-12 ENCOUNTER — Telehealth: Payer: Self-pay

## 2023-10-12 LAB — C. DIFFICILE GDH AND TOXIN A/B
GDH ANTIGEN: NOT DETECTED
MICRO NUMBER:: 15848524
SPECIMEN QUALITY:: ADEQUATE
TOXIN A AND B: NOT DETECTED

## 2023-10-12 NOTE — Progress Notes (Signed)
This has been addressed in previous lab results. Pt aware.

## 2023-10-12 NOTE — Telephone Encounter (Signed)
Received call from [facility]venia Beam) wanted to give results for Patient GI Pathogen and Isolate. Advised that we have received results and patient has been contacted. Provided my name as proof of contact to office.

## 2023-10-12 NOTE — Telephone Encounter (Signed)
Noted  

## 2023-10-13 ENCOUNTER — Ambulatory Visit
Admission: RE | Admit: 2023-10-13 | Discharge: 2023-10-13 | Disposition: A | Discharge status: Home / Self Care | Payer: BC Managed Care – PPO | Source: Ambulatory Visit | Attending: Family | Admitting: Family

## 2023-10-13 DIAGNOSIS — R9389 Abnormal findings on diagnostic imaging of other specified body structures: Secondary | ICD-10-CM | POA: Diagnosis present

## 2023-10-13 DIAGNOSIS — E039 Hypothyroidism, unspecified: Secondary | ICD-10-CM

## 2023-10-13 LAB — GASTROINTESTINAL PATHOGEN PNL
CampyloBacter Group: DETECTED — AB
Norovirus GI/GII: DETECTED — AB
Rotavirus A: DETECTED — AB
Salmonella species: NOT DETECTED
Shiga Toxin 1: NOT DETECTED
Shiga Toxin 2: NOT DETECTED
Shigella Species: NOT DETECTED
Vibrio Group: NOT DETECTED
Yersinia enterocolitica: NOT DETECTED

## 2023-10-13 LAB — ISOLATE REFERRAL PH
MICRO NUMBER:: 15853674
SPECIMEN QUALITY:: ADEQUATE

## 2023-10-13 LAB — GIARDIA ANTIGEN
MICRO NUMBER:: 15850060
RESULT:: NOT DETECTED
SPECIMEN QUALITY:: ADEQUATE

## 2023-10-14 NOTE — Progress Notes (Signed)
This was addressed, and treated.

## 2023-10-19 ENCOUNTER — Other Ambulatory Visit: Payer: Self-pay | Admitting: Family

## 2023-10-19 ENCOUNTER — Encounter: Payer: Self-pay | Admitting: Family

## 2023-10-19 DIAGNOSIS — J4521 Mild intermittent asthma with (acute) exacerbation: Secondary | ICD-10-CM

## 2023-11-16 ENCOUNTER — Encounter: Payer: Self-pay | Admitting: Family

## 2023-11-16 DIAGNOSIS — K219 Gastro-esophageal reflux disease without esophagitis: Secondary | ICD-10-CM

## 2023-11-17 MED ORDER — OMEPRAZOLE 20 MG PO CPDR: 20.0000 mg | DELAYED_RELEASE_CAPSULE | Freq: Every day | ORAL | 0 refills | Status: DC

## 2023-12-18 ENCOUNTER — Other Ambulatory Visit: Payer: Self-pay | Admitting: Family

## 2023-12-18 DIAGNOSIS — F53 Postpartum depression: Secondary | ICD-10-CM

## 2023-12-18 DIAGNOSIS — F418 Other specified anxiety disorders: Secondary | ICD-10-CM

## 2024-01-04 ENCOUNTER — Other Ambulatory Visit: Payer: Self-pay | Admitting: Obstetrics and Gynecology

## 2024-01-04 DIAGNOSIS — E039 Hypothyroidism, unspecified: Secondary | ICD-10-CM

## 2024-01-04 DIAGNOSIS — O24410 Gestational diabetes mellitus in pregnancy, diet controlled: Secondary | ICD-10-CM

## 2024-01-04 DIAGNOSIS — I10 Essential (primary) hypertension: Secondary | ICD-10-CM

## 2024-01-04 DIAGNOSIS — O09522 Supervision of elderly multigravida, second trimester: Secondary | ICD-10-CM

## 2024-02-09 ENCOUNTER — Ambulatory Visit: Admitting: Family

## 2024-02-09 ENCOUNTER — Encounter: Payer: Self-pay | Admitting: Family

## 2024-02-09 VITALS — BP 118/64 | HR 98 | Temp 97.7°F | Ht 62.0 in | Wt 217.8 lb

## 2024-02-09 DIAGNOSIS — E039 Hypothyroidism, unspecified: Secondary | ICD-10-CM

## 2024-02-09 DIAGNOSIS — R202 Paresthesia of skin: Secondary | ICD-10-CM

## 2024-02-09 DIAGNOSIS — E538 Deficiency of other specified B group vitamins: Secondary | ICD-10-CM | POA: Diagnosis not present

## 2024-02-09 DIAGNOSIS — M2550 Pain in unspecified joint: Secondary | ICD-10-CM | POA: Diagnosis not present

## 2024-02-09 DIAGNOSIS — Z6841 Body Mass Index (BMI) 40.0 and over, adult: Secondary | ICD-10-CM

## 2024-02-09 DIAGNOSIS — R7989 Other specified abnormal findings of blood chemistry: Secondary | ICD-10-CM | POA: Insufficient documentation

## 2024-02-09 LAB — VITAMIN B12: Vitamin B-12: 264 pg/mL (ref 211–911)

## 2024-02-09 LAB — TSH: TSH: 0.58 u[IU]/mL (ref 0.35–5.50)

## 2024-02-09 LAB — C-REACTIVE PROTEIN: CRP: <1 mg/dL (ref 0.5–20.0)

## 2024-02-09 LAB — FOLATE: Folate: 14 ng/mL (ref >5.9)

## 2024-02-09 NOTE — Assessment & Plan Note (Signed)
 Repeat today Continue otc supplementation

## 2024-02-09 NOTE — Assessment & Plan Note (Signed)
 Not at goal  Repeat tsh today pending results Cont levothyroxine 175 mcg once daily

## 2024-02-09 NOTE — Progress Notes (Signed)
 Established Patient Office Visit  Subjective:   Patient ID: Desiree Santos, female    DOB: January 18, 1986  Age: 38 y.o. MRN: [id number]  CC:  Chief Complaint  Patient presents with   Numbness    Bilateral hands. Been ongoing for about 2 months. Constant. Right is worse than left    HPI: Desiree Santos is a 38 y.o. female presenting on 02/09/2024 for Numbness (Bilateral hands. Been ongoing for about 2 months. Constant. Right is worse than left)  For the last two moths feeling bil hand numbness, right greater than left. The numbness is constant despite positional changes. She describes it as a stinging tingly sensation. If she is writing the pain intensifies. Hurts to even hold her fold. Fingers feel tight, grip is not affected able to open a can and or can put on buttons on a shirt. She states prior to pregnancy she had slight numbness right hand but it went away with pregnancy until most recent onset of episode. Some joint stiffness in hands   Denies neck movement pain and or muscle spasm in neck area. Slight tenderness in left upper shoulder, slightly affected by movement but no known trauma to the area.   Thyroid u/s 10/13/23 heterogenous otherwise normal   B12 def and folate def: still taking daily vitamin B12 and folate 1 mg Takes leovhtyorxine separate from food and or other medications     ROS: Negative unless specifically indicated above in HPI.   Relevant past medical history reviewed and updated as indicated.   Allergies and medications reviewed and updated.   Current Outpatient Medications:    albuterol (VENTOLIN HFA) 108 (90 Base) MCG/ACT inhaler, TAKE 2 PUFFS BY MOUTH EVERY 6 HOURS AS NEEDED FOR WHEEZE OR SHORTNESS OF BREATH, Disp: 8.5 each, Rfl: 0   azithromycin (ZITHROMAX) 500 MG tablet, Take two tablets for one dose (total 1000 mg), Disp: 2 tablet, Rfl: 0   fluticasone-salmeterol (WIXELA INHUB) 100-50 MCG/ACT AEPB, INHALE 1 PUFF INTO THE LUNGS TWICE A DAY, Disp: 60  each, Rfl: 0   folic acid (FOLVITE) 1 MG tablet, Take 2 tablets (2 mg total) by mouth daily., Disp: 180 tablet, Rfl: 3   levothyroxine (SYNTHROID) 100 MCG tablet, Take one 100 mcg tablet along with one 88 mcg tablet for total of 188 mcg once daily. Do not take with food or other medications for at least thirty minutes., Disp: 30 tablet, Rfl: 1   levothyroxine (SYNTHROID) 75 MCG tablet, Take 75 mcg by mouth at bedtime., Disp: , Rfl:    omeprazole (PRILOSEC) 20 MG capsule, Take 1 capsule (20 mg total) by mouth daily., Disp: 90 capsule, Rfl: 0   ondansetron (ZOFRAN-ODT) 4 MG disintegrating tablet, Take 1 tablet (4 mg total) by mouth every 8 (eight) hours as needed for nausea or vomiting., Disp: 20 tablet, Rfl: 0   promethazine (PHENERGAN) 6.25 MG/5ML solution, Take 5 mLs by mouth every 6 (six) hours as needed for nausea or vomiting., Disp: , Rfl:    sertraline (ZOLOFT) 50 MG tablet, TAKE 1 TABLET BY MOUTH EVERY DAY, Disp: 90 tablet, Rfl: 0   labetalol (NORMODYNE) 200 MG tablet, Take 1 tablet by mouth 2 (two) times daily., Disp: , Rfl:    potassium chloride (MICRO-K) 10 MEQ CR capsule, Take 1 capsule (10 mEq total) by mouth 2 (two) times daily for 7 days., Disp: 14 capsule, Rfl: 0  No Known Allergies  Objective:   BP 118/64   Pulse 98   Temp 97.7 F (  36.5 C) (Oral)   Ht 5\' 2"  (1.575 m)   Wt 217 lb 12.8 oz (98.8 kg)   SpO2 97%   BMI 39.84 kg/m    Physical Exam Neck:     Comments: Neg spurlings  Slight kyphosis Musculoskeletal:     Right hand: No tenderness. Normal range of motion. Normal strength. Decreased sensation.     Left hand: Normal range of motion. Normal strength. Decreased sensation.     Cervical back: No edema. No pain with movement, spinous process tenderness or muscular tenderness. Normal range of motion.     Comments: Neg allens      Assessment & Plan:  Vitamin B12 deficiency Assessment & Plan: Repeat today Continue otc supplementation   Orders: -     Vitamin  B12  Acquired hypothyroidism Assessment & Plan: Not at goal  Repeat tsh today pending results Cont levothyroxine 175 mcg once daily    Orders: -     TSH  Polyarthralgia -     Rheumatoid factor -     C-reactive protein -     ANA Screen,IFA, with Reflex to Titer and Pattern (REFL)  Folate deficiency Assessment & Plan: Repeat today pending results Cont otc folate 1 mg  Orders: -     Folate  Morbid obesity with BMI of 40.0-44.9, adult (HCC) -     Hemoglobin A1c; Future  Paresthesia of hand, bilateral Assessment & Plan: Unclear etiology  Ordering tsh b12  Consider cervicalgia vs possible carpal tunnel although less likely due to bil onset  Wear braces at night time to see if improvement with night time immobility A1c ordered Referral placed for neurosurgery for eval No demonstrated abn on physical exam for neck, however could consider cervicalgia, kyphosis could be contributing.   Orders: -     Vitamin B12 -     Rheumatoid factor -     C-reactive protein -     ANA Screen,IFA, with Reflex to Titer and Pattern (REFL) -     Ambulatory referral to Neurosurgery  Elevated LFTs   Last metabolic panel Lab Results  Component Value Date   GLUCOSE 116 (H) 10/08/2023   NA 138 10/08/2023   K 3.1 (L) 10/08/2023   CL 104 10/08/2023   CO2 26 10/08/2023   BUN 9 10/08/2023   CREATININE 0.66 10/08/2023   GFR 111.67 10/08/2023   CALCIUM 8.9 10/08/2023   PROT 6.7 10/08/2023   ALBUMIN 4.0 10/08/2023   BILITOT 0.4 10/08/2023   ALKPHOS 57 10/08/2023   AST 19 10/08/2023   ALT 73 (H) 10/08/2023   ANIONGAP 15 10/05/2023     Follow up plan: Return if symptoms worsen or fail to improve.  Felicita Horns, FNP

## 2024-02-09 NOTE — Assessment & Plan Note (Signed)
 Repeat today pending results Cont otc folate 1 mg

## 2024-02-09 NOTE — Assessment & Plan Note (Signed)
 Unclear etiology  Ordering tsh b12  Consider cervicalgia vs possible carpal tunnel although less likely due to bil onset  Wear braces at night time to see if improvement with night time immobility A1c ordered Referral placed for neurosurgery for eval No demonstrated abn on physical exam for neck, however could consider cervicalgia, kyphosis could be contributing.

## 2024-02-09 NOTE — Patient Instructions (Signed)
  A referral was placed today for neurosurgery  Please let us know if you have not heard back within 2 weeks about the referral.

## 2024-02-10 ENCOUNTER — Encounter: Payer: Self-pay | Admitting: Family

## 2024-02-12 LAB — RHEUMATOID FACTOR: Rheumatoid fact SerPl-aCnc: <10 [IU]/mL (ref <14)

## 2024-02-12 LAB — ANA SCREEN,IFA, WITH REFLEX TO TITER AND PATTERN (REFL): ANA SCREEN, IFA: NEGATIVE

## 2024-02-14 ENCOUNTER — Other Ambulatory Visit: Payer: Self-pay | Admitting: Family

## 2024-02-14 DIAGNOSIS — K219 Gastro-esophageal reflux disease without esophagitis: Secondary | ICD-10-CM

## 2024-02-15 ENCOUNTER — Ambulatory Visit (INDEPENDENT_AMBULATORY_CARE_PROVIDER_SITE_OTHER): Admitting: Neurosurgery

## 2024-02-15 ENCOUNTER — Ambulatory Visit
Admission: RE | Admit: 2024-02-15 | Discharge: 2024-02-15 | Disposition: A | Discharge status: Home / Self Care | Attending: Neurosurgery | Admitting: Neurosurgery

## 2024-02-15 ENCOUNTER — Encounter: Payer: Self-pay | Admitting: Neurosurgery

## 2024-02-15 ENCOUNTER — Ambulatory Visit
Admission: RE | Admit: 2024-02-15 | Discharge: 2024-02-15 | Disposition: A | Discharge status: Home / Self Care | Source: Ambulatory Visit | Attending: Neurosurgery | Admitting: Neurosurgery

## 2024-02-15 VITALS — BP 142/96 | Ht 62.0 in | Wt 220.0 lb

## 2024-02-15 DIAGNOSIS — R202 Paresthesia of skin: Secondary | ICD-10-CM

## 2024-02-15 NOTE — Progress Notes (Signed)
 Referring Physician:  Felicita Horns, FNP [address] [place],  [place] [address]  Primary Physician:  Desiree Horns, FNP  History of Present Illness: 02/15/2024 Ms. Desiree Santos is here today with a chief complaint of bilateral hand numbness.  She states that they just gotten this before when she was pregnant and it went away but now its come back for at least the past 2 to 3 months.  She notices worse when she is driving holding her baby, also wakes her up from sleep.  She has to wring out her hands.  She states that her first 4 digits are the worst and then her pinky will get involved if its severe.  She has not had any prior workup.  She does have a history of B12 deficiency as well as hypothyroidism both of which are being medically replaced.  She has not had any nerve conduction study, she does get some periscapular region discomfort but has not had typical neck pain.  She has never worn braces.  She does state that sometimes when she moves her neck a certain way she will get severe shooting pain down her arm.  Her right side is worse than her left side.  Review of Systems:  A 10 point review of systems is negative, except for the pertinent positives and negatives detailed in the HPI.  Past Medical History: Past Medical History:  Diagnosis Date   Anemia    Chicken pox    COVID-19 05/26/2023   Depression    Elevated BP without diagnosis of hypertension    GERD (gastroesophageal reflux disease)    Gestational diabetes    Headache    History of kidney stones    History of UTI    Hypertension    Hypothyroidism    Thyroid  disease     Past Surgical History: Past Surgical History:  Procedure Laterality Date   CHOLECYSTECTOMY     DILATION AND CURETTAGE OF UTERUS N/A 05/06/2023   Procedure: SUCTION DILATATION AND CURETTAGE;  Surgeon: Desiree Brod, MD;  Location: [facility] ORS;  Service: Gynecology;  Laterality: N/A;   KIDNEY SURGERY  2002   h/o kidney reflux?    LAPAROSCOPIC TUBAL LIGATION Bilateral 06/08/2023   Procedure: LAPAROSCOPIC TUBAL LIGATION;  Surgeon: Desiree Brod, MD;  Location: [facility] ORS;  Service: Gynecology;  Laterality: Bilateral;   TONSILLECTOMY     WISDOM TOOTH EXTRACTION Right 10/20/2022    Allergies: Allergies as of 02/15/2024   (No Known Allergies)    Medications:  Current Outpatient Medications:    Cyanocobalamin  (VITAMIN B 12) 500 MCG TABS, Take by mouth., Disp: , Rfl:    folic acid  (FOLVITE ) 1 MG tablet, Take 2 tablets (2 mg total) by mouth daily., Disp: 180 tablet, Rfl: 3   levothyroxine  (SYNTHROID ) 100 MCG tablet, Take one 100 mcg tablet along with one 88 mcg tablet for total of 188 mcg once daily. Do not take with food or other medications for at least thirty minutes., Disp: 30 tablet, Rfl: 1   levothyroxine  (SYNTHROID ) 75 MCG tablet, Take 75 mcg by mouth at bedtime., Disp: , Rfl:    omeprazole  (PRILOSEC) 20 MG capsule, TAKE 1 CAPSULE BY MOUTH EVERY DAY, Disp: 90 capsule, Rfl: 1   sertraline  (ZOLOFT ) 50 MG tablet, TAKE 1 TABLET BY MOUTH EVERY DAY, Disp: 90 tablet, Rfl: 0   potassium chloride  (MICRO-K ) 10 MEQ CR capsule, Take 1 capsule (10 mEq total) by mouth 2 (two) times daily for 7 days., Disp: 14  capsule, Rfl: 0  Social History: Social History   Tobacco Use   Smoking status: Former    Current packs/day: 0.00    Types: Cigarettes    Quit date: 2022    Years since quitting: 3.3   Smokeless tobacco: Never  Vaping Use   Vaping status: Never Used  Substance Use Topics   Alcohol use: No   Drug use: No    Family Medical History: Family History  Problem Relation Age of Onset   Hyperlipidemia Mother    Hypertension Mother    Heart disease Father    Sudden Cardiac Death Father        age 27   Arthritis Maternal Grandmother    Cancer Maternal Grandmother    Depression Maternal Grandmother    Hypertension Maternal Grandmother    Dementia Maternal Grandmother    Hypertension Maternal Grandfather    Early  death Paternal Grandmother    Cancer Paternal Grandmother    Breast cancer Paternal Grandmother        thinks in her 62's   Diabetes Paternal Grandfather    Cancer Paternal Grandfather    Hypertension Paternal Grandfather    Hyperlipidemia Paternal Grandfather    Heart disease Paternal Grandfather    Early death Paternal Grandfather    Depression Paternal Grandfather     Physical Examination: Vitals:   02/15/24 0944  BP: (!) 142/96    General: Patient is in no apparent distress. Attention to examination is appropriate.  Neck:   Supple.  Full range of motion.  Respiratory: Patient is breathing without any difficulty.   NEUROLOGICAL:     Awake, alert, oriented to person, place, and time.  Speech is clear and fluent.   Cranial Nerves: Pupils equal round and reactive to light.  Facial tone is symmetric. Shoulder shrug is symmetric. Tongue protrusion is midline.  There is no pronator drift.  Motor Exam:  She has good strength in her bilateral upper and lower extremities both proximally and distally.  No evidence of intrinsic hand wasting.  Reflexes are 1-2+ and symmetric at the biceps, triceps, brachioradialis, patella and achilles.   Hoffman's is absent  She does have some constant dysesthesias paresthesias noted in her first 4 digits, she has a positive carpal compression test, positive Phalen's test, positive Tinel at the bilateral carpal tunnel.  Gait is normal.     Medical Decision Making  Imaging: No imaging to review at this appointment  Electrodiagnostics: No electrodiagnostic testing to review at this appointment    Assessment and Plan: Ms. Desiree Santos is a pleasant 38 y.o. female with history of bilateral hand numbness and tingling.  She had this previously while she was pregnant and then this is resolved.  However over the past 2 to 3 months has had worsening symptoms again.  She notices while she is driving, sleeping, using her phone, and holding her baby.  Her  hands will go numb.  She states that it feels like all of her fingers but that the first for the most involved bilaterally.  She does intermittently get some shooting pain from her neck down her arm when she moves her neck into a certain position.  She does not have classic neck pain all the time.  Will plan to have her evaluated for carpal tunnel syndrome as her symptoms are somewhat consistent with this presentation, we will plan to get a nerve conduction study/EMG and have recommended nighttime bracing for her.  Also on the EMG she has a history  of B12 deficiency as well as hypothyroidism so a neuropathy is on the list as well, however with the lack of lower extremity symptoms this would be somewhat more rare.  I have made referral for a EMG nerve conduction study to evaluate for radiculopathy versus carpal tunnel syndrome.  Have also made referral for x-rays to evaluate for any cervical spondylosis given the sometimes shooting pains from her neck all the way down her arms.  I would like to see her back in approximately 2 months after she has had her nerve conduction study to discuss any plans going forward.  This will also help us  to follow-up on her bracing trial.  Thank you for involving me in the care of this patient.    Carroll Clamp MD/MSCR Neurosurgery - Peripheral Nerve Surgery

## 2024-02-22 ENCOUNTER — Ambulatory Visit: Admitting: Physician Assistant

## 2024-02-26 ENCOUNTER — Other Ambulatory Visit: Payer: Self-pay

## 2024-02-26 ENCOUNTER — Encounter: Payer: Self-pay | Admitting: Neurology

## 2024-02-26 DIAGNOSIS — R202 Paresthesia of skin: Secondary | ICD-10-CM

## 2024-03-01 ENCOUNTER — Encounter: Payer: Self-pay | Admitting: Neurosurgery

## 2024-03-09 ENCOUNTER — Other Ambulatory Visit: Payer: Self-pay

## 2024-03-09 DIAGNOSIS — R202 Paresthesia of skin: Secondary | ICD-10-CM

## 2024-03-21 ENCOUNTER — Encounter: Payer: Self-pay | Admitting: Family

## 2024-03-21 DIAGNOSIS — F418 Other specified anxiety disorders: Secondary | ICD-10-CM

## 2024-03-21 DIAGNOSIS — F53 Postpartum depression: Secondary | ICD-10-CM

## 2024-03-22 ENCOUNTER — Ambulatory Visit (INDEPENDENT_AMBULATORY_CARE_PROVIDER_SITE_OTHER): Admitting: Neurology

## 2024-03-22 DIAGNOSIS — G5603 Carpal tunnel syndrome, bilateral upper limbs: Secondary | ICD-10-CM

## 2024-03-22 DIAGNOSIS — R202 Paresthesia of skin: Secondary | ICD-10-CM

## 2024-03-22 MED ORDER — SERTRALINE HCL 50 MG PO TABS: 50.0000 mg | ORAL_TABLET | Freq: Every day | ORAL | 1 refills | Status: DC

## 2024-03-22 NOTE — Procedures (Signed)
  [facility]  [address] [place], [address]  [place], [place] [address] Tel: [phone] Fax: [phone] Test Date:  03/22/2024  Patient: Desiree Santos DOB: [birth year] Physician: Rommie Coats, MD  Sex: Female Height: 5\' 2"  Ref Phys: Henderson Lock, MD  ID#: [id number]   Technician:    History: This is a 38 year old female with bilateral hand numbness and tingling.  Findings: High frequency (4.0-16.0 MHz) B-mode, nonvascular ultrasound of the bilateral upper limbs shows: Cross sectional areas (CSA) of bilateral median (palm to wrist) are increased at bilateral wrists (right 21.9 mm2, left 19.2 mm2). Wrist to forearm ratios of bilateral median nerves are increased (right 4.13, left 4.92). No other obvious lesion involving the adjacent bone or tendon is identified. No definite vascular abnormalities.  Impression: Ultrasonographic evidence of entrapment of bilateral median nerves at the wrist segment, based on increased cross sectional area (CSA) at the level of the pisiform bone and an increased ratio comparing the CSA measurement of the median nerve at the wrist to the forearm, consistent with a clinical diagnosis of bilateral carpal tunnel syndrome.   _______________  Rommie Coats, MD [facility]   Nerve Measurements   Site Area Width [phone] Segment Area Ratio Mobility Vascularity Comment   mm Norm mm mm   Norm     Left Median  Wrist *19.2  < 13.0           Forearm 3.9  < 10.7    Wrist - Forearm *4.92  < 1.50      Right Median  Wrist *21.9  < 13.0  1.6 1.6        Forearm 5.3  < 10.7    Wrist - Forearm *4.13  < 1.50       Ultrasound Images:

## 2024-03-22 NOTE — Procedures (Signed)
 [facility]  [address], [address]  [place], [place] [address] Tel: [phone] Fax: [phone] Test Date:  03/22/2024  Patient: Desiree Santos DOB: 10-May-1986 Physician: Rommie Coats, MD  Sex: Female Height: 5\' 2"  Ref Phys: Henderson Lock, MD  ID#: [id number]   Technician:    History: This is a 38 year old female with bilateral hand numbness and tingling.  NCV & EMG Findings: Extensive electrodiagnostic evaluation of bilateral upper limbs shows: Bilateral median sensory responses show prolonged distal peak latency (L5.2, R5.3 ms) and reduced amplitude (L7, R8 V). Bilateral ulnar and radial sensory responses are within normal limits. Bilateral median (APB) motor responses show prolonged distal onset latency (L4.5, R5.3 ms). Bilateral ulnar (ADM) motor responses are within normal limits. Chronic motor axon loss changes without accompanying active denervation changes are seen in the right abductor pollicis brevis muscle. All other tested muscles are within normal limits with normal motor unit configuration and recruitment patterns.  Impression: This is an abnormal study. The findings are most consistent with the following: Evidence of bilateral median mononeuropathy at or distal to the wrist, consistent with carpal tunnel syndrome, moderate in degree electrically. No electrodiagnostic evidence of right or left cervical (C5-T1) motor radiculopathy. Screening studies for right or left ulnar or radial mononeuropathies are normal.    ___________________________ Rommie Coats, MD    Nerve Conduction Studies Motor Nerve Results    Latency Amplitude F-Lat Segment Distance CV Comment  Site (ms) Norm (mV) Norm (ms)  (cm) (m/s) Norm   Left Median (APB) Motor  Wrist *4.5  < 3.9 6.8  > 6.0        Elbow 8.7 - 5.6 -  Elbow-Wrist 25 60  > 50   Right Median (APB) Motor  Wrist *5.3  < 3.9 6.1  > 6.0        Elbow 9.9 - 4.9 -  Elbow-Wrist 24.5 53  > 50   Left Ulnar (ADM) Motor   Wrist 1.68  < 3.1 14.0  > 7.0        Bel elbow 4.8 - 13.5 -  Bel elbow-Wrist 19 61  > 50   Ab elbow 6.4 - 13.1 -  Ab elbow-Bel elbow 10 63 -   Right Ulnar (ADM) Motor  Wrist 1.68  < 3.1 14.5  > 7.0        Bel elbow 4.9 - 14.0 -  Bel elbow-Wrist 21 66  > 50   Ab elbow 6.3 - 13.9 -  Ab elbow-Bel elbow 10 71 -    Sensory Sites    Neg Peak Lat Amplitude (O-P) Segment Distance Velocity Comment  Site (ms) Norm (V) Norm  (cm) (ms)   Left Median Sensory  Wrist-Dig II *5.2  < 3.4 *7  > 20 Wrist-Dig II 13    Right Median Sensory  Wrist-Dig II *5.3  < 3.4 *8  > 20 Wrist-Dig II 13    Left Radial Sensory  Forearm-Wrist 1.93  < 2.7 22  > 18 Forearm-Wrist 10    Right Radial Sensory  Forearm-Wrist 2.4  < 2.7 26  > 18 Forearm-Wrist 10    Left Ulnar Sensory  Wrist-Dig V 2.8  < 3.1 26  > 12 Wrist-Dig V 11    Right Ulnar Sensory  Wrist-Dig V 2.5  < 3.1 18  > 12 Wrist-Dig V 11     Electromyography   Side Muscle Ins.Act Fibs Fasc Recrt Amp Dur Poly Activation Comment  Left FDI Nml Nml Nml  Nml Nml Nml Nml Nml N/A  Left EIP Nml Nml Nml Nml Nml Nml Nml Nml N/A  Left Pronator teres Nml Nml Nml Nml Nml Nml Nml Nml N/A  Left APB Nml Nml Nml Nml Nml Nml Nml Nml N/A  Left Biceps Nml Nml Nml Nml Nml Nml Nml Nml N/A  Left Triceps Nml Nml Nml Nml Nml Nml Nml Nml N/A  Left Deltoid Nml Nml Nml Nml Nml Nml Nml Nml N/A  Right FDI Nml Nml Nml Nml Nml Nml Nml Nml N/A  Right EIP Nml Nml Nml Nml Nml Nml Nml Nml N/A  Right Pronator teres Nml Nml Nml Nml Nml Nml Nml Nml N/A  Right APB Nml Nml Nml *1- *1+ *1+ *1+ Nml N/A  Right Biceps Nml Nml Nml Nml Nml Nml Nml Nml N/A  Right Triceps Nml Nml Nml Nml Nml Nml Nml Nml N/A  Right Deltoid Nml Nml Nml Nml Nml Nml Nml Nml N/A      Waveforms:  Motor           Sensory

## 2024-03-29 ENCOUNTER — Ambulatory Visit: Payer: Self-pay | Admitting: Neurosurgery

## 2024-04-01 ENCOUNTER — Ambulatory Visit: Admitting: Neurosurgery

## 2024-04-01 DIAGNOSIS — G5603 Carpal tunnel syndrome, bilateral upper limbs: Secondary | ICD-10-CM

## 2024-04-01 DIAGNOSIS — R202 Paresthesia of skin: Secondary | ICD-10-CM

## 2024-04-01 NOTE — Progress Notes (Signed)
 I had a follow-up phone visit today with Ms. Millman.  She was at home and I was in the office.  She gave consent to go forward with a phone visit.  In the interim she has had a evaluation by neurology with the EMG and nerve conduction study.  She was found to have moderate carpal tunnel syndrome bilaterally.  She has been wearing her nighttime braces.  She does not feel like she is getting significant improvement from her nighttime braces, we discussed injections versus carpal tunnel decompression, she would like to try the more conservative route first which we agree with.  Will plan on making referral to our pain team for carpal tunnel injections bilaterally.  Spent a total of 10 minutes on the phone call today.  Carroll Clamp, MD [facility] [place].

## 2024-04-14 ENCOUNTER — Encounter: Payer: Self-pay | Admitting: Family

## 2024-04-18 ENCOUNTER — Ambulatory Visit: Admitting: Neurosurgery

## 2024-04-28 ENCOUNTER — Ambulatory Visit
Attending: Student in an Organized Health Care Education/Training Program | Admitting: Student in an Organized Health Care Education/Training Program

## 2024-04-28 ENCOUNTER — Encounter: Payer: Self-pay | Admitting: Student in an Organized Health Care Education/Training Program

## 2024-04-28 VITALS — BP 140/87 | HR 87 | Temp 97.5°F | Resp 16 | Ht 61.0 in | Wt 230.0 lb

## 2024-04-28 DIAGNOSIS — R202 Paresthesia of skin: Secondary | ICD-10-CM | POA: Diagnosis present

## 2024-04-28 DIAGNOSIS — G5603 Carpal tunnel syndrome, bilateral upper limbs: Secondary | ICD-10-CM | POA: Insufficient documentation

## 2024-04-28 NOTE — Progress Notes (Signed)
 Safety precautions to be maintained throughout the outpatient stay will include: orient to surroundings, keep bed in low position, maintain call bell within reach at all times, provide assistance with transfer out of bed and ambulation.

## 2024-04-28 NOTE — Progress Notes (Signed)
 PROVIDER NOTE: Interpretation of information contained herein should be left to medically-trained personnel. Specific patient instructions are provided elsewhere under Patient Instructions section of medical record. This document was created in part using AI and STT-dictation technology, any transcriptional errors that may result from this process are unintentional.  Patient: Desiree Santos  Service: E/M Encounter  Provider: Wallie Sherry, MD  DOB: [birth year]  Delivery: Face-to-face  Specialty: Interventional Pain Management  MRN: [id number]  Setting: Ambulatory outpatient facility  Specialty designation: 09  Type: New Patient  Location: Outpatient office facility  PCP: Corwin Antu, FNP  DOS: 04/28/2024    Referring Prov.: Claudene Penne ORN, MD   Primary Reason(s) for Visit: Encounter for initial evaluation of one or more chronic problems (new to examiner) potentially causing chronic pain, and posing a threat to normal musculoskeletal function. (Level of risk: High) CC: Hand Pain (bilat)  HPI  Desiree Santos is a 38 y.o. year old, female patient, who comes for the first time to our practice referred by Claudene Penne ORN, MD for our initial evaluation of her chronic pain. She has Hypothyroidism; Gastroesophageal reflux disease without esophagitis; HLD (hyperlipidemia); Morbid obesity with BMI of 40.0-44.9, adult (HCC); Nonintractable headache; Chronic migraine; Iron  deficiency anemia; Primary hypertension; Chronic hypertension affecting pregnancy; Vitamin B12 deficiency; Elevated blood uric acid level; Hypomagnesemia; Reactive airway disease; Postpartum depression; Depression with anxiety; Proteinuria; Elevated LFTs; Paresthesia of hand, bilateral; Folate deficiency; and Bilateral carpal tunnel syndrome on their problem list. Today she comes in for evaluation of her Hand Pain (bilat)  Pain Assessment: Location: Right, Left Hand (Pt states discomfort begins as tingling, then numb then burning sensation,;  sometimes right shoulder  aches as a result,) Radiating:   Onset: More than a month ago Duration: Chronic pain Quality: Aching, Burning, Tingling, Numbness Severity: 8 /10 (subjective, self-reported pain score)  Effect on ADL: prevents pt from holding 36 month old baby Timing: Constant Modifying factors: nothing BP: (!) 140/87  HR: 87  Onset and Duration: Present longer than 3 months Cause of pain: Unknown Severity: NAS-11 at its worse: 10/10, NAS-11 at its best: 7/10, NAS-11 now: 10/10, and NAS-11 on the average: 10/10 Timing: Not influenced by the time of the day Aggravating Factors: Eating, Lifiting, and Motion Alleviating Factors: nothing Associated Problems: Depression, Numbness, Pain that wakes patient up, and Pain that does not allow patient to sleep Quality of Pain: Burning, Nagging, Tingling, and Uncomfortable Previous Examinations or Tests: Nerve conduction test Previous Treatments: Acetaminophen , NSAIDs, bracing  Desiree Santos is being evaluated for possible interventional pain management therapies for the treatment of her chronic pain.   Discussed the use of AI scribe software for clinical note transcription with the patient, who gave verbal consent to proceed.  History of Present Illness   Desiree Santos is a 38 year old female who presents with carpal tunnel syndrome. She was referred by Dr. Claudene and the neurosurgery team for carpal tunnel syndrome.  She has been experiencing symptoms of carpal tunnel syndrome for almost a year. Initially, the symptoms began in her left hand, affecting three fingers. During her pregnancy, the symptoms resolved completely, but they returned two months postpartum, affecting both hands and all fingers.  She describes the symptoms as constant tingling and numbness in all fingers. The symptoms are persistent and do not change with movement. The sensation is like her fingers are numb all the time.  No change in symptoms with specific hand  movements.       Meds  Current Outpatient Medications:    Cyanocobalamin  (VITAMIN B 12) 500 MCG TABS, Take by mouth., Disp: , Rfl:    folic acid  (FOLVITE ) 1 MG tablet, Take 2 tablets (2 mg total) by mouth daily., Disp: 180 tablet, Rfl: 3   levothyroxine  (SYNTHROID ) 100 MCG tablet, Take one 100 mcg tablet along with one 88 mcg tablet for total of 188 mcg once daily. Do not take with food or other medications for at least thirty minutes., Disp: 30 tablet, Rfl: 1   levothyroxine  (SYNTHROID ) 75 MCG tablet, Take 75 mcg by mouth at bedtime., Disp: , Rfl:    omeprazole  (PRILOSEC) 20 MG capsule, TAKE 1 CAPSULE BY MOUTH EVERY DAY, Disp: 90 capsule, Rfl: 1   sertraline  (ZOLOFT ) 50 MG tablet, Take 1 tablet (50 mg total) by mouth daily., Disp: 90 tablet, Rfl: 1   potassium chloride  (MICRO-K ) 10 MEQ CR capsule, Take 1 capsule (10 mEq total) by mouth 2 (two) times daily for 7 days. (Patient not taking: Reported on 04/28/2024), Disp: 14 capsule, Rfl: 0  Imaging Review   DG Cervical Spine Complete  Narrative CLINICAL DATA:  Numbness in the hands bilaterally for 2 months  EXAM: CERVICAL SPINE - COMPLETE 4+ VIEW  COMPARISON:  None Available.  FINDINGS: Seven cervical segments are well visualized. Vertebral body height is well maintained. Alignment is within normal limits. No prevertebral soft tissue abnormality is seen. Flexion and extension shows no significant instability. The odontoid is within normal limits.  IMPRESSION: No acute abnormality in the cervical spine.   Electronically Signed By: Oneil Devonshire M.D. On: 02/27/2024 20:09    Complexity Note: Imaging results reviewed.                         ROS  Cardiovascular: High blood pressure Pulmonary or Respiratory: No reported pulmonary signs or symptoms such as wheezing and difficulty taking a deep full breath (Asthma), difficulty blowing air out (Emphysema), coughing up mucus (Bronchitis), persistent dry cough, or temporary  stoppage of breathing during sleep Neurological: No reported neurological signs or symptoms such as seizures, abnormal skin sensations, urinary and/or fecal incontinence, being born with an abnormal open spine and/or a tethered spinal cord Psychological-Psychiatric: Depressed Gastrointestinal: No reported gastrointestinal signs or symptoms such as vomiting or evacuating blood, reflux, heartburn, alternating episodes of diarrhea and constipation, inflamed or scarred liver, or pancreas or irrregular and/or infrequent bowel movements Genitourinary: No reported renal or genitourinary signs or symptoms such as difficulty voiding or producing urine, peeing blood, non-functioning kidney, kidney stones, difficulty emptying the bladder, difficulty controlling the flow of urine, or chronic kidney disease Hematological: No reported hematological signs or symptoms such as prolonged bleeding, low or poor functioning platelets, bruising or bleeding easily, hereditary bleeding problems, low energy levels due to low hemoglobin or being anemic Endocrine: thyroid  disease Rheumatologic: No reported rheumatological signs and symptoms such as fatigue, joint pain, tenderness, swelling, redness, heat, stiffness, decreased range of motion, with or without associated rash Musculoskeletal: Negative for myasthenia gravis, muscular dystrophy, multiple sclerosis or malignant hyperthermia Work History: Working full time  Allergies  Desiree Santos has no known allergies.  Laboratory Chemistry Profile   Renal Lab Results  Component Value Date   BUN 9 10/08/2023   CREATININE 0.66 10/08/2023   GFR 111.67 10/08/2023   GFRAA >60 11/15/2019   GFRNONAA >60 10/05/2023   SPECGRAV >=1.030 (A) 10/08/2023   PHUR 5.0 10/08/2023   PROTEINUR Positive (A) 10/08/2023     Electrolytes Lab  Results  Component Value Date   NA 138 10/08/2023   K 3.1 (L) 10/08/2023   CL 104 10/08/2023   CALCIUM 8.9 10/08/2023   MG 1.7 09/22/2023      Hepatic Lab Results  Component Value Date   AST 19 10/08/2023   ALT 73 (H) 10/08/2023   ALBUMIN 4.0 10/08/2023   ALKPHOS 57 10/08/2023   AMYLASE 18 (L) 10/08/2023   LIPASE 14.0 10/08/2023     ID Lab Results  Component Value Date   HIV neg 04/20/2023   SARSCOV2NAA NEGATIVE 10/05/2023   PREGTESTUR NEGATIVE 10/05/2023     Bone No results found for: VD25OH, [id number], CI6874NY7, CI7874NY7, 25OHVITD1, 25OHVITD2, 25OHVITD3, TESTOFREE, TESTOSTERONE   Endocrine Lab Results  Component Value Date   GLUCOSE 116 (H) 10/08/2023   GLUCOSEU NEGATIVE 10/05/2023   HGBA1C 5.6 10/08/2023   TSH 0.58 02/09/2024   FREET4 0.47 (L) 09/24/2016     Neuropathy Lab Results  Component Value Date   VITAMINB12 264 02/09/2024   FOLATE 14.0 02/09/2024   HGBA1C 5.6 10/08/2023   HIV neg 04/20/2023     CNS No results found for: COLORCSF, APPEARCSF, RBCCOUNTCSF, WBCCSF, POLYSCSF, LYMPHSCSF, EOSCSF, PROTEINCSF, GLUCCSF, JCVIRUS, CSFOLI, IGGCSF, LABACHR, ACETBL   Inflammation (CRP: Acute  ESR: Chronic) Lab Results  Component Value Date   CRP <1.0 02/09/2024     Rheumatology Lab Results  Component Value Date   RF <10 02/09/2024   LABURIC 8.0 (H) 09/22/2023     Coagulation Lab Results  Component Value Date   PLT 273.0 10/08/2023     Cardiovascular Lab Results  Component Value Date   HGB 12.2 10/08/2023   HCT 36.0 10/08/2023     Screening Lab Results  Component Value Date   SARSCOV2NAA NEGATIVE 10/05/2023   HIV neg 04/20/2023   PREGTESTUR NEGATIVE 10/05/2023     Cancer No results found for: CEA, CA125, LABCA2   Allergens No results found for: ALMOND, APPLE, ASPARAGUS, AVOCADO, BANANA, BARLEY, BASIL, BAYLEAF, GREENBEAN, LIMABEAN, WHITEBEAN, BEEFIGE, REDBEET, BLUEBERRY, BROCCOLI, CABBAGE, MELON, CARROT, CASEIN, CASHEWNUT, CAULIFLOWER, CELERY     Note: Lab results reviewed.  PFSH   Drug: Desiree Santos  reports no history of drug use. Alcohol:  reports no history of alcohol use. Tobacco:  reports that she quit smoking about 3 years ago. Her smoking use included cigarettes. She has never used smokeless tobacco. Medical:  has a past medical history of Anemia, Chicken pox, COVID-19 (05/26/2023), Depression, Elevated BP without diagnosis of hypertension, GERD (gastroesophageal reflux disease), Gestational diabetes, Headache, History of kidney stones, History of UTI, Hypertension, Hypothyroidism, and Thyroid  disease. Family: family history includes Arthritis in her maternal grandmother; Breast cancer in her paternal grandmother; Cancer in her maternal grandmother, paternal grandfather, and paternal grandmother; Dementia in her maternal grandmother; Depression in her maternal grandmother and paternal grandfather; Diabetes in her paternal grandfather; Early death in her paternal grandfather and paternal grandmother; Heart disease in her father and paternal grandfather; Hyperlipidemia in her mother and paternal grandfather; Hypertension in her maternal grandfather, maternal grandmother, mother, and paternal grandfather; Sudden Cardiac Death in her father.  Past Surgical History:  Procedure Laterality Date   CHOLECYSTECTOMY     DILATION AND CURETTAGE OF UTERUS N/A 05/06/2023   Procedure: SUCTION DILATATION AND CURETTAGE;  Surgeon: Verdon Keen, MD;  Location: [facility];  Service: Gynecology;  Laterality: N/A;   KIDNEY SURGERY  2002   h/o kidney reflux?   LAPAROSCOPIC TUBAL LIGATION Bilateral 06/08/2023   Procedure: LAPAROSCOPIC TUBAL LIGATION;  Surgeon: Verdon,  Heather, MD;  Location: [facility];  Service: Gynecology;  Laterality: Bilateral;   TONSILLECTOMY     WISDOM TOOTH EXTRACTION Right 10/20/2022   Active Ambulatory Problems    Diagnosis Date Noted   Hypothyroidism 09/24/2016   Gastroesophageal reflux disease without esophagitis 09/24/2016   HLD (hyperlipidemia) 09/24/2016    Morbid obesity with BMI of 40.0-44.9, adult (HCC) 11/05/2016   Nonintractable headache 02/03/2017   Chronic migraine 02/05/2017   Iron  deficiency anemia 07/14/2022   Primary hypertension 07/21/2022   Chronic hypertension affecting pregnancy 04/28/2023   Vitamin B12 deficiency 09/22/2023   Elevated blood uric acid level 09/22/2023   Hypomagnesemia 09/22/2023   Reactive airway disease 09/22/2023   Postpartum depression 09/22/2023   Depression with anxiety 09/23/2023   Proteinuria 10/08/2023   Elevated LFTs 02/09/2024   Paresthesia of hand, bilateral 02/09/2024   Folate deficiency 02/09/2024   Bilateral carpal tunnel syndrome 04/28/2024   Resolved Ambulatory Problems    Diagnosis Date Noted   Fatigue 09/24/2016   Polydipsia 07/14/2022   Amenorrhea 07/21/2022   Left elbow pain 07/28/2022   Heartburn 07/28/2022   [redacted] weeks gestation of pregnancy 10/28/2022   Decreased fetal movement 02/20/2023   Supervision of high risk pregnancy in second trimester 09/25/2022   Supervision of high risk pregnancy in third trimester 09/25/2022   Pneumonia involving left lung 09/16/2023   Elevated alkaline phosphatase level 09/22/2023   Wheezing 09/22/2023   Dysuria 10/08/2023   Nausea vomiting and diarrhea 10/08/2023   Diarrhea of presumed infectious origin 10/08/2023   Right upper quadrant abdominal pain 10/08/2023   Past Medical History:  Diagnosis Date   Anemia    Chicken pox    COVID-19 05/26/2023   Depression    Elevated BP without diagnosis of hypertension    GERD (gastroesophageal reflux disease)    Gestational diabetes    Headache    History of kidney stones    History of UTI    Hypertension    Thyroid  disease    Constitutional Exam  General appearance: Well nourished, well developed, and well hydrated. In no apparent acute distress Vitals:   04/28/24 0759  BP: (!) 140/87  Pulse: 87  Resp: 16  Temp: (!) 97.5 F (36.4 C)  SpO2: 98%  Weight: 230 lb (104.3 kg)  Height: 5'  1 (1.549 m)   BMI Assessment: Estimated body mass index is 43.46 kg/m as calculated from the following:   Height as of this encounter: 5' 1 (1.549 m).   Weight as of this encounter: 230 lb (104.3 kg).  BMI interpretation table: BMI level Category Range association with higher incidence of chronic pain  <18 kg/m2 Underweight   18.5-24.9 kg/m2 Ideal body weight   25-29.9 kg/m2 Overweight Increased incidence by 20%  30-34.9 kg/m2 Obese (Class I) Increased incidence by 68%  35-39.9 kg/m2 Severe obesity (Class II) Increased incidence by 136%  >40 kg/m2 Extreme obesity (Class III) Increased incidence by 254%   Patient's current BMI Ideal Body weight  Body mass index is 43.46 kg/m. Ideal body weight: 47.8 kg (105 lb 6.1 oz) Adjusted ideal body weight: 70.4 kg (155 lb 3.6 oz)   BMI Readings from Last 4 Encounters:  04/28/24 43.46 kg/m  02/15/24 40.24 kg/m  02/09/24 39.84 kg/m  10/08/23 39.25 kg/m   Wt Readings from Last 4 Encounters:  04/28/24 230 lb (104.3 kg)  02/15/24 220 lb (99.8 kg)  02/09/24 217 lb 12.8 oz (98.8 kg)  10/05/23 214 lb 9.6 oz (97.3 kg)  Psych/Mental status: Alert, oriented x 3 (person, place, & time)       Eyes: PERLA Respiratory: No evidence of acute respiratory distress  Upper Extremity (UE) Exam    Side: Right upper extremity  Side: Left upper extremity  Skin & Extremity Inspection: Skin color, temperature, and hair growth are WNL. No peripheral edema or cyanosis. No masses, redness, swelling, asymmetry, or associated skin lesions. No contractures.  Skin & Extremity Inspection: Skin color, temperature, and hair growth are WNL. No peripheral edema or cyanosis. No masses, redness, swelling, asymmetry, or associated skin lesions. No contractures.  Functional ROM: Unrestricted ROM          Functional ROM: Unrestricted ROM          Muscle Tone/Strength: Functionally intact. No obvious neuro-muscular anomalies detected.  Muscle Tone/Strength: Functionally  intact. No obvious neuro-muscular anomalies detected.  Sensory (Neurological): Unimpaired          Sensory (Neurological): Unimpaired          Palpation: No palpable anomalies              Palpation: No palpable anomalies              Provocative Test(s):  Phalen's test: (+) for CTS Tinel's test: (+) for CTS Apley's scratch test (touch opposite shoulder):  Action 1 (Across chest): deferred Action 2 (Overhead): deferred Action 3 (LB reach): deferred   Provocative Test(s):  Phalen's test: (+) for CTS Tinel's test: (+) for CTS Apley's scratch test (touch opposite shoulder):  Action 1 (Across chest): deferred Action 2 (Overhead): deferred Action 3 (LB reach): deferred     Assessment  Primary Diagnosis & Pertinent Problem List: The primary encounter diagnosis was Bilateral carpal tunnel syndrome. A diagnosis of Paresthesia of hand, bilateral was also pertinent to this visit.  Visit Diagnosis (New problems to examiner): 1. Bilateral carpal tunnel syndrome   2. Paresthesia of hand, bilateral    Plan of Care (Initial workup plan)  Assessment and Plan    Carpal Tunnel Syndrome   Chronic carpal tunnel syndrome affects both hands, beginning in the left and later involving all fingers. Symptoms include constant tingling and numbness, which temporarily resolved during pregnancy but recurred postpartum. The condition results from median nerve compression. Perform bilateral carpal tunnel steroid injection with ultrasound guidance on July 16th, pending insurance approval. Advise that relief may take time as steroids need to take effect. Instruct her to compress the area post-injection to aid medication dispersion. Discuss potential outcomes, including minimal relief if nerve compression is severe, which may require surgical intervention.        Procedure Orders         Therapeutic injection carpal tunnel      Provider-requested follow-up: Return in about 13 days (around 05/11/2024) for B/L CTS  with US .  No future appointments. I discussed the assessment and treatment plan with the patient. The patient was provided an opportunity to ask questions and all were answered. The patient agreed with the plan and demonstrated an understanding of the instructions.  Patient advised to call back or seek an in-person evaluation if the symptoms or condition worsens.  Duration of encounter: .  Total time on encounter, as per AMA guidelines included both the face-to-face and non-face-to-face time personally spent by the physician and/or other qualified health care professional(s) on the day of the encounter (includes time in activities that require the physician or other qualified health care professional and does not include time in activities normally performed  by clinical staff). Physician's time may include the following activities when performed: Preparing to see the patient (e.g., pre-charting review of records, searching for previously ordered imaging, lab work, and nerve conduction tests) Review of prior analgesic pharmacotherapies. Reviewing PMP Interpreting ordered tests (e.g., lab work, imaging, nerve conduction tests) Performing post-procedure evaluations, including interpretation of diagnostic procedures Obtaining and/or reviewing separately obtained history Performing a medically appropriate examination and/or evaluation Counseling and educating the patient/family/caregiver Ordering medications, tests, or procedures Referring and communicating with other health care professionals (when not separately reported) Documenting clinical information in the electronic or other health record Independently interpreting results (not separately reported) and communicating results to the patient/ family/caregiver Care coordination (not separately reported)  Note by: Wallie Sherry, MD (TTS and AI technology used. I apologize for any typographical errors that were not detected and  corrected.) Date: 04/28/2024; Time: 8:23 AM

## 2024-05-11 ENCOUNTER — Encounter: Payer: Self-pay | Admitting: Student in an Organized Health Care Education/Training Program

## 2024-05-11 ENCOUNTER — Ambulatory Visit
Attending: Student in an Organized Health Care Education/Training Program | Admitting: Student in an Organized Health Care Education/Training Program

## 2024-05-11 VITALS — BP 138/106 | HR 104 | Temp 97.5°F | Resp 16 | Ht 61.0 in | Wt 230.0 lb

## 2024-05-11 DIAGNOSIS — R202 Paresthesia of skin: Secondary | ICD-10-CM | POA: Insufficient documentation

## 2024-05-11 DIAGNOSIS — G5603 Carpal tunnel syndrome, bilateral upper limbs: Secondary | ICD-10-CM | POA: Insufficient documentation

## 2024-05-11 MED ORDER — LIDOCAINE HCL 2 % IJ SOLN
20.0000 mL | Freq: Once | INTRAMUSCULAR | Status: AC
  Administered 2024-05-11: 400 mg

## 2024-05-11 MED ORDER — DEXAMETHASONE SODIUM PHOSPHATE 10 MG/ML IJ SOLN
INTRAMUSCULAR | Status: AC
  Filled 2024-05-11: qty 2

## 2024-05-11 MED ORDER — DEXAMETHASONE SODIUM PHOSPHATE 10 MG/ML IJ SOLN
20.0000 mg | Freq: Once | INTRAMUSCULAR | Status: AC
  Administered 2024-05-11: 20 mg

## 2024-05-11 MED ORDER — ROPIVACAINE HCL 2 MG/ML IJ SOLN
INTRAMUSCULAR | Status: AC
  Filled 2024-05-11: qty 20

## 2024-05-11 MED ORDER — LIDOCAINE HCL 2 % IJ SOLN
INTRAMUSCULAR | Status: AC
  Filled 2024-05-11: qty 20

## 2024-05-11 MED ORDER — ROPIVACAINE HCL 2 MG/ML IJ SOLN
9.0000 mL | Freq: Once | INTRAMUSCULAR | Status: AC
  Administered 2024-05-11: 9 mL via PERINEURAL

## 2024-05-11 NOTE — Progress Notes (Signed)
 PROVIDER NOTE: Interpretation of information contained herein should be left to medically-trained personnel. Specific patient instructions are provided elsewhere under Patient Instructions section of medical record. This document was created in part using STT-dictation technology, any transcriptional errors that may result from this process are unintentional.  Patient: Desiree Santos Type: Established DOB: 28-Feb-1986 MRN: [id number] PCP: Corwin Antu, FNP  Service: Procedure DOS: 05/11/2024 Setting: Ambulatory Location: Ambulatory outpatient facility Delivery: Face-to-face Provider: Wallie Sherry, MD Specialty: Interventional Pain Management Specialty designation: 09 Location: Outpatient facility Ref. Prov.: Corwin Antu, FNP       Interventional Therapy   Primary Reason for Visit: Interventional Pain Management Treatment. CC: Wrist Pain (bilat)    Procedure:          Anesthesia, Analgesia, Anxiolysis:  Type: Carpal Tunnel Injection (Median Nerve Block)  #1  Primary Purpose: Diagnostic Target Area: Space parallel to median nerve on ulnar side of carpal tunnel. Approach: Parallel to the palmaris longus tendon, on the ulnar side Region: Palmar aspect of the wrist Level: Between the middle and distal wrist creases Laterality: Bilateral  Type: Local Anesthesia Local Anesthetic: Lidocaine  1-2% Sedation: None  Indication(s):  Analgesia Route: Infiltration (Grant/IM) IV Access: N/A   Position: Sitting with forearm in supine position over mayo tray/table   1. Bilateral carpal tunnel syndrome   2. Paresthesia of hand, bilateral    NAS-11 Pain score:   Pre-procedure: 8 /10   Post-procedure: 6  (finger tips numb bilateral)/10     H&P (Pre-op Assessment):  Desiree Santos is a 38 y.o. (year old), female patient, seen today for interventional treatment. She  has a past surgical history that includes Cholecystectomy; Tonsillectomy; Kidney surgery (2002); Wisdom tooth extraction (Right,  10/20/2022); Dilation and curettage of uterus (N/A, 05/06/2023); and Laparoscopic tubal ligation (Bilateral, 06/08/2023). Desiree Santos has a current medication list which includes the following prescription(s): vitamin b 12, folic acid , levothyroxine , levothyroxine , omeprazole , potassium chloride , and sertraline . Her primarily concern today is the Wrist Pain (bilat)  Initial Vital Signs:  Pulse/HCG Rate: (!) 104  Temp: (!) 97.5 F (36.4 C) Resp: 16 BP: (!) 138/106 SpO2: 100 %  BMI: Estimated body mass index is 43.46 kg/m as calculated from the following:   Height as of this encounter: 5' 1 (1.549 m).   Weight as of this encounter: 230 lb (104.3 kg).  Risk Assessment: Allergies: Reviewed. She has no known allergies.  Allergy Precautions: None required Coagulopathies: Reviewed. None identified.  Blood-thinner therapy: None at this time Active Infection(s): Reviewed. None identified. [name] is afebrile  Site Confirmation: [name] was asked to confirm the procedure and laterality before marking the site Procedure checklist: Completed Consent: Before the procedure and under the influence of no sedative(s), amnesic(s), or anxiolytics, the patient was informed of the treatment options, risks and possible complications. To fulfill our ethical and legal obligations, as recommended by the American Medical Association's Code of Ethics, I have informed the patient of my clinical impression; the nature and purpose of the treatment or procedure; the risks, benefits, and possible complications of the intervention; the alternatives, including doing nothing; the risk(s) and benefit(s) of the alternative treatment(s) or procedure(s); and the risk(s) and benefit(s) of doing nothing. The patient was provided information about the general risks and possible complications associated with the procedure. These may include, but are not limited to: failure to achieve desired goals, infection, bleeding, organ or  nerve damage, allergic reactions, paralysis, and death. In addition, the patient was informed of those risks and complications  associated to the procedure, such as failure to decrease pain; infection; bleeding; organ or nerve damage with subsequent damage to sensory, motor, and/or autonomic systems, resulting in permanent pain, numbness, and/or weakness of one or several areas of the body; allergic reactions; (i.e.: anaphylactic reaction); and/or death. Furthermore, the patient was informed of those risks and complications associated with the medications. These include, but are not limited to: allergic reactions (i.e.: anaphylactic or anaphylactoid reaction(s)); adrenal axis suppression; blood sugar elevation that in diabetics may result in ketoacidosis or comma; water retention that in patients with history of congestive heart failure may result in shortness of breath, pulmonary edema, and decompensation with resultant heart failure; weight gain; swelling or edema; medication-induced neural toxicity; particulate matter embolism and blood vessel occlusion with resultant organ, and/or nervous system infarction; and/or aseptic necrosis of one or more joints. Finally, the patient was informed that Medicine is not an exact science; therefore, there is also the possibility of unforeseen or unpredictable risks and/or possible complications that may result in a catastrophic outcome. The patient indicated having understood very clearly. We have given the patient no guarantees and we have made no promises. Enough time was given to the patient to ask questions, all of which were answered to the patient's satisfaction. [name] has indicated that she wanted to continue with the procedure. Attestation: I, the ordering provider, attest that I have discussed with the patient the benefits, risks, side-effects, alternatives, likelihood of achieving goals, and potential problems during recovery for the procedure that I have  provided informed consent. Date  Time: 05/11/2024  8:10 AM  Pre-Procedure Preparation:  Monitoring: As per clinic protocol. Respiration, ETCO2, SpO2, BP, heart rate and rhythm monitor placed and checked for adequate function Safety Precautions: Patient was assessed for positional comfort and pressure points before starting the procedure. Time-out: I initiated and conducted the Time-out before starting the procedure, as per protocol. The patient was asked to participate by confirming the accuracy of the Time Out information. Verification of the correct person, site, and procedure were performed and confirmed by me, the nursing staff, and the patient. Time-out conducted as per Joint Commission's Universal Protocol (UP.01.01.01). Time: 0832 Start Time: 0833 hrs.  Description of Procedure:          Area Prepped: Entire palmar aspect of the forearm, wrist, and hand. ChloraPrep (2% chlorhexidine  gluconate and 70% isopropyl alcohol) Procedural Technique Safety Precautions: Aspiration looking for blood return was conducted prior to all injections. At no point did we inject any substances, as a needle was being advanced. No attempts were made at seeking any paresthesias. Safe injection practices and needle disposal techniques used. Medications properly checked for expiration dates. SDV (single dose vial) medications used. Description of the Procedure: Protocol guidelines were followed. The patient was assisted into a comfortable position. The target area was identified and the area prepped in the usual manner. Skin & deeper tissues infiltrated with local anesthetic. Appropriate amount of time allowed to pass for local anesthetics to take effect. The procedure needles were then advanced to the target area. Proper needle placement secured. Negative aspiration confirmed. Solution injected in intermittent fashion, asking for systemic symptoms every 0.5cc of injectate. The needles were then removed and the  area cleansed, making sure to leave some of the prepping solution back to take advantage of its long term bactericidal properties. Vitals:   05/11/24 0808  BP: (!) 138/106  Pulse: (!) 104  Resp: 16  Temp: (!) 97.5 F (36.4 C)  SpO2: 100%  Weight: 230 lb (104.3 kg)  Height: 5' 1 (1.549 m)    Start Time: [phone] hrs. End Time: 0836 hrs. Materials:  Needle(s) Type: Regular needle Gauge: 22G Length: 1.5-in  8 cc solution made of 6 cc of 0.2% ropivacaine , 2 cc of Decadron  10 mg/cc.  4 cc injected for the left carpal tunnel, 4 cc injected for the right carpal tunnel based on landmark technique  Post-operative Assessment:  Post-procedure Vital Signs:  Pulse/HCG Rate: (!) 104  Temp: (!) 97.5 F (36.4 C) Resp: 16 BP: (!) 138/106 SpO2: 100 %  EBL: None  Complications: No immediate post-treatment complications observed by team, or reported by patient.  Note: The patient tolerated the entire procedure well. A repeat set of vitals were taken after the procedure and the patient was kept under observation following institutional policy, for this type of procedure. Post-procedural neurological assessment was performed, showing return to baseline, prior to discharge. The patient was provided with post-procedure discharge instructions, including a section on how to identify potential problems. Should any problems arise concerning this procedure, the patient was given instructions to immediately contact us , at any time, without hesitation. In any case, we plan to contact the patient by telephone for a follow-up status report regarding this interventional procedure.  Comments:  No additional relevant information.  Plan of Care (POC)  Orders:  No orders of the defined types were placed in this encounter.  Medications ordered for procedure: Meds ordered this encounter  Medications   lidocaine  (XYLOCAINE ) 2 % (with pres) injection 400 mg   dexamethasone  (DECADRON ) injection 20 mg   ropivacaine   (PF) 2 mg/mL (0.2%) (NAROPIN ) injection 9 mL   Medications administered: We administered lidocaine , dexamethasone , and ropivacaine  (PF) 2 mg/mL (0.2%).  See the medical record for exact dosing, route, and time of administration.    Follow-up plan:   Return in about 2 weeks (around 05/25/2024) for PPE, F2F.     Recent Visits Date Type Provider Dept  04/28/24 Office Visit Marcelino Nurse, MD [facility]  Showing recent visits within past 90 days and meeting all other requirements Today's Visits Date Type Provider Dept  05/11/24 Procedure visit Marcelino Nurse, MD [facility]  Showing today's visits and meeting all other requirements Future Appointments Date Type Provider Dept  05/26/24 Appointment Marcelino Nurse, MD [facility]  Showing future appointments within next 90 days and meeting all other requirements   Disposition: Discharge home  Discharge (Date  Time): 05/11/2024; 0841 hrs.   Primary Care Physician: Corwin Antu, FNP Location: [facility] Note by: Nurse Marcelino, MD (TTS technology used. I apologize for any typographical errors that were not detected and corrected.) Date: 05/11/2024; Time: 9:03 AM  Disclaimer:  Medicine is not an Visual merchandiser. The only guarantee in medicine is that nothing is guaranteed. It is important to note that the decision to proceed with this intervention was based on the information collected from the patient. The Data and conclusions were drawn from the patient's questionnaire, the interview, and the physical examination. Because the information was provided in large part by the patient, it cannot be guaranteed that it has not been purposely or unconsciously manipulated. Every effort has been made to obtain as much relevant data as possible for this evaluation. It is important to note that the conclusions that lead to this procedure are derived in large part from the available data. Always take  into account that the treatment will also be dependent on availability of  resources and existing treatment guidelines, considered by other Pain Management Practitioners as being common knowledge and practice, at the time of the intervention. For Medico-Legal purposes, it is also important to point out that variation in procedural techniques and pharmacological choices are the acceptable norm. The indications, contraindications, technique, and results of the above procedure should only be interpreted and judged by a Board-Certified Interventional Pain Specialist with extensive familiarity and expertise in the same exact procedure and technique.

## 2024-05-11 NOTE — Progress Notes (Signed)
 Safety precautions to be maintained throughout the outpatient stay will include: orient to surroundings, keep bed in low position, maintain call bell within reach at all times, provide assistance with transfer out of bed and ambulation.

## 2024-05-11 NOTE — Patient Instructions (Signed)

## 2024-05-12 ENCOUNTER — Telehealth: Payer: Self-pay

## 2024-05-12 NOTE — Telephone Encounter (Signed)
 PP follow up.  Patient states she is doing well./

## 2024-05-17 ENCOUNTER — Encounter: Payer: Self-pay | Admitting: Family

## 2024-05-26 ENCOUNTER — Ambulatory Visit: Admitting: Nurse Practitioner

## 2024-07-07 ENCOUNTER — Encounter: Payer: Self-pay | Admitting: Family

## 2024-07-07 DIAGNOSIS — E039 Hypothyroidism, unspecified: Secondary | ICD-10-CM

## 2024-07-07 MED ORDER — LEVOTHYROXINE SODIUM 75 MCG PO TABS: 75.0000 ug | ORAL_TABLET | Freq: Every day | ORAL | 2 refills | Status: DC

## 2024-07-07 MED ORDER — LEVOTHYROXINE SODIUM 100 MCG PO TABS: ORAL_TABLET | ORAL | 2 refills | Status: DC

## 2024-08-22 ENCOUNTER — Encounter
Admission: RE | Admit: 2024-08-22 | Discharge: 2024-08-22 | Disposition: A | Discharge status: Home / Self Care | Source: Ambulatory Visit | Attending: Obstetrics and Gynecology | Admitting: Obstetrics and Gynecology

## 2024-08-22 ENCOUNTER — Other Ambulatory Visit: Payer: Self-pay

## 2024-08-22 HISTORY — DX: Abnormal uterine and vaginal bleeding, unspecified: N93.9

## 2024-08-22 NOTE — Patient Instructions (Addendum)
 Your procedure is scheduled on: Friday 08/26/24 Report to the Registration Desk on the 1st floor of the [facility]. To find out your arrival time, please call [phone] between 1PM - 3PM on: Thursday 08/25/24 If your arrival time is 6:00 am, do not arrive before that time as the [facility] entrance doors do not open until 6:00 am.  REMEMBER: Instructions that are not followed completely may result in serious medical risk, up to and including death; or upon the discretion of your surgeon and anesthesiologist your surgery may need to be rescheduled.  Do not eat food after midnight the night before surgery.  No gum chewing or hard candies.  You may however, drink CLEAR liquids up to 2 hours before you are scheduled to arrive for your surgery. Do not drink anything within 2 hours of your scheduled arrival time.  Clear liquids include: - water  - apple juice without pulp - black coffee or tea (Do NOT add milk or creamers to the coffee or tea) Do NOT drink anything that is not on this list.   One week prior to surgery: Stop Anti-inflammatories (NSAIDS) such as Advil , Aleve, Ibuprofen , Motrin , Naproxen, Naprosyn and Aspirin based products such as Excedrin, Goody's Powder, BC Powder. Stop ANY OVER THE COUNTER supplements until after surgery.  You may however, continue to take Tylenol  if needed for pain up until the day of surgery.  Continue taking all of your other prescription medications up until the day of surgery.  ON THE DAY OF SURGERY ONLY TAKE THESE MEDICATIONS WITH SIPS OF WATER:  levothyroxine  (SYNTHROID ) 175 MCG  omeprazole  (PRILOSEC) 20 MG  sertraline  (ZOLOFT ) 50 MG   No Alcohol for 24 hours before or after surgery.  No Smoking including e-cigarettes for 24 hours before surgery.  No chewable tobacco products for at least 6 hours before surgery.  No nicotine patches on the day of surgery.  Do not use any recreational drugs for at least a week (preferably 2 weeks)  before your surgery.  Please be advised that the combination of cocaine and anesthesia may have negative outcomes, up to and including death. If you test positive for cocaine, your surgery will be cancelled.  On the morning of surgery brush your teeth with toothpaste and water, you may rinse your mouth with mouthwash if you wish. Do not swallow any toothpaste or mouthwash.  Take a fresh shower the morning of your procedure.  Do not wear jewelry, make-up, hairpins, clips or nail polish.  For welded (permanent) jewelry: bracelets, anklets, waist bands, etc.  Please have this removed prior to surgery.  If it is not removed, there is a chance that hospital personnel will need to cut it off on the day of surgery.  Do not wear lotions, powders, or perfumes.   Do not shave body hair from the neck down 48 hours before surgery.  Contact lenses, hearing aids and dentures may not be worn into surgery.  Do not bring valuables to the hospital. [facility] is not responsible for any missing/lost belongings or valuables.   Notify your doctor if there is any change in your medical condition (cold, fever, infection).  Wear comfortable clothing (specific to your surgery type) to the hospital.  After surgery, you can help prevent lung complications by doing breathing exercises.  Take deep breaths and cough every 1-2 hours. Your doctor may order a device called an Incentive Spirometer to help you take deep breaths. When coughing or sneezing, hold a pillow firmly  against your incision with both hands. This is called "splinting." Doing this helps protect your incision. It also decreases belly discomfort.  If you are being admitted to the hospital overnight, leave your suitcase in the car. After surgery it may be brought to your room.  In case of increased patient census, it may be necessary for you, the patient, to continue your postoperative care in the Same Day Surgery department.  If you are being  discharged the day of surgery, you will not be allowed to drive home. You will need a responsible individual to drive you home and stay with you for 24 hours after surgery.   If you are taking public transportation, you will need to have a responsible individual with you.  Please call the Pre-admissions Testing Dept. at [phone] if you have any questions about these instructions.  Surgery Visitation Policy:  Patients having surgery or a procedure may have two visitors.  Children under the age of 76 must have an adult with them who is not the patient.  Inpatient Visitation:    Visiting hours are 7 a.m. to 8 p.m. Up to four visitors are allowed at one time in a patient room. The visitors may rotate out with other people during the day.  One visitor age 17 or older may stay with the patient overnight and must be in the room by 8 p.m.   Merchandiser, Retail to address health-related social needs:  [email or web address]

## 2024-08-24 ENCOUNTER — Encounter
Admission: RE | Admit: 2024-08-24 | Discharge: 2024-08-24 | Disposition: A | Discharge status: Home / Self Care | Source: Ambulatory Visit | Attending: Obstetrics and Gynecology | Admitting: Obstetrics and Gynecology

## 2024-08-24 DIAGNOSIS — Z01812 Encounter for preprocedural laboratory examination: Secondary | ICD-10-CM | POA: Diagnosis present

## 2024-08-24 DIAGNOSIS — N939 Abnormal uterine and vaginal bleeding, unspecified: Secondary | ICD-10-CM | POA: Diagnosis not present

## 2024-08-24 LAB — CBC
HCT: 37.8 % (ref 36.0–46.0)
Hemoglobin: 12.5 g/dL (ref 12.0–15.0)
MCH: 28.2 pg (ref 26.0–34.0)
MCHC: 33.1 g/dL (ref 30.0–36.0)
MCV: 85.3 fL (ref 80.0–100.0)
Platelets: 335 K/uL (ref 150–400)
RBC: 4.43 MIL/uL (ref 3.87–5.11)
RDW: 12.8 % (ref 11.5–15.5)
WBC: 11.8 K/uL — ABNORMAL HIGH (ref 4.0–10.5)
nRBC: 0 % (ref 0.0–0.2)

## 2024-08-24 LAB — BASIC METABOLIC PANEL WITH GFR
Anion gap: 11 (ref 5–15)
BUN: 16 mg/dL (ref 6–20)
CO2: 23 mmol/L (ref 22–32)
Calcium: 9.3 mg/dL (ref 8.9–10.3)
Chloride: 104 mmol/L (ref 98–111)
Creatinine, Ser: 0.4 mg/dL — ABNORMAL LOW (ref 0.44–1.00)
GFR, Estimated: >60 mL/min (ref >60)
Glucose, Bld: 83 mg/dL (ref 70–99)
Potassium: 3.6 mmol/L (ref 3.5–5.1)
Sodium: 138 mmol/L (ref 135–145)

## 2024-08-25 NOTE — H&P (Signed)
 Desiree Santos is a 38 y.o. female .  History of Present Illness: Intermittent heavy menstrual bleeding post-pregnancy, requiring pad changes every 30 minutes for three days out of a seven-day cycle. Differential diagnosis includes hormonal imbalances, thyroid  dysfunction, or possible ovarian cysts. Previous ultrasound indicated large cysts, which may contribute to the bleeding. No menopausal symptoms reported.   - Irregular menstrual cycles with some months of amenorrhea and other months with heavy menstrual bleeding - Heavy bleeding requires changing pads every thirty minutes for three days during a seven-day cycle - Bleeding is associated with passage of large clots - Able to feel the bleeding while walking - Heavy bleeding began after pregnancy - By the third day of menses, experiences malaise and concern for anemia due to blood loss  Gynecologic surgical history - History of surgical sterilization with bilateral tubal removal for birth control  CT scan for pelvic pain and pressure prompted the below u/s in the ER in Dec 2024   10/13/23: 1. Thickened endometrium with irregular borders and multiple small  cystic spaces. Gynecological consultation and hysteroscopy is  recommended for further evaluation.   2. Left ovarian 3.6 cm simple cyst. This is likely a benign finding  and follow up is recommended as clinically indicated.   3.  Small amount of cul-de-sac fluid.  Screening: Pertinent lab review: I have reviewed her last pap smear and her pregnancy hx.  Last pap: 10/2022 Last Mammogram: n/a Depression screening (PHQ-9) score: Negative   Past Medical History:  has a past medical history of Anxiety, Depression, GERD (gastroesophageal reflux disease), Hyperlipidemia, Hypertension, and Thyroid  disease.  Past Surgical History:  has a past surgical history that includes Cholecystectomy (2004); Replacement of ureter (Bilateral, 1999); dilation & currettage (05/06/2023); and Laparoscopic  tubal ligation. Family History: family history includes Breast cancer (age of onset: 59) in her paternal grandmother; Cancer in her maternal grandmother; Colon cancer (age of onset: 89) in her paternal grandmother; Depression in her maternal grandmother; High blood pressure (Hypertension) in her mother; Hyperlipidemia (Elevated cholesterol) in her maternal grandfather, maternal grandmother, and mother; Lung cancer (age of onset: 72) in her paternal grandmother; Myocardial Infarction (Heart attack) (age of onset: 9) in her father; No Known Problems in her son; Sudden cardiac death (age of onset: 37) in her father; Thyroid  disease in her maternal grandfather and sister. Social History:  reports that she quit smoking about 7 years ago. Her smoking use included cigarettes. She started smoking about 9 years ago. She has never used smokeless tobacco. She reports that she does not currently use alcohol. She reports that she does not use drugs. OB/GYN History:  OB History     Gravida  2   Para  2   Term  2   Preterm      AB      Living  2      SAB      IAB      Ectopic      Molar      Multiple      Live Births  2          Allergies: has no known allergies. Medications:  Current Outpatient Medications:    levothyroxine  (SYNTHROID ) 100 MCG tablet, Take 1 tablet (100 mcg total) by mouth once daily, Disp: 90 tablet, Rfl: 3   levothyroxine  (SYNTHROID ) 75 MCG tablet, Take 1 tablet (75 mcg total) by mouth once daily Take on an empty stomach with a glass of water at least 30-60 minutes before  breakfast., Disp: 90 tablet, Rfl: 3   omeprazole  (PRILOSEC) 20 MG DR capsule, Take 20 mg by mouth once daily, Disp: , Rfl:    cyanocobalamin  (VITAMIN B12) 1,000 mcg/mL injection, Inject 1 mL (1,000 mcg total) into the muscle once a week (Patient not taking: Reported on 07/28/2024), Disp: 1 mL, Rfl: 3   docusate (COLACE) 100 MG capsule, Take 100 mg by mouth 2 (two) times daily (Patient not taking:  Reported on 07/28/2024), Disp: , Rfl:    ibuprofen  (MOTRIN ) 800 MG tablet, TAKE 1 TABLET BY MOUTH EVERY 8 (EIGHT) HOURS FOR 3 DAYS. AFTER 72HRS, MAY TAKE AS NEEDED (Patient not taking: Reported on 07/28/2024), Disp: , Rfl:    labetaloL  (TRANDATE ) 200 MG tablet, Take 2 tablets (400 mg) by mouth in the morning with breakfast, then 1 tablet (200 mg) by mouth at lunch, then 2 tablets (400 mg) at bedtime for 30 days (Patient not taking: Reported on 07/28/2024), Disp: 150 tablet, Rfl: 0   oxyCODONE  (ROXICODONE ) 5 MG immediate release tablet, Take 5 mg by mouth every 4 (four) hours as needed (Patient not taking: Reported on 07/28/2024), Disp: , Rfl:    promethazine -dextromethorphan (PROMETHAZINE -DM) 6.25-15 mg/5 mL syrup, Take 5 mLs by mouth every 6 (six) hours as needed for Cough (Patient not taking: Reported on 10/05/2023), Disp: 120 mL, Rfl: 0   sertraline  (ZOLOFT ) 50 MG tablet, Take 50 mg by mouth once daily (Patient not taking: Reported on 07/28/2024), Disp: , Rfl:   Current Facility-Administered Medications:    cyanocobalamin  (VITAMIN B12) injection 1,000 mcg, 1,000 mcg, Intramuscular, Q7 Days, Vernel Therisa Browning, CNM, 1,000 mcg at 04/09/23 1016  Review of Systems: No SOB, no palpitations or chest pain, no new lower extremity edema, no nausea or vomiting or bowel or bladder complaints. See HPI for gyn specific ROS.   Exam:   BP 114/76   Ht 157.5 cm (5' 2)   Wt (!) 105.8 kg (233 lb 3.2 oz)   LMP 07/12/2024 (Exact Date)   BMI 42.65 kg/m   General: Patient is well-groomed, well-nourished, appears stated age in no acute distress   HEENT: head is atraumatic and normocephalic, trachea is midline, neck is supple with no palpable nodules   CV: Regular rhythm and normal heart rate, no murmur   Pulm: Clear to auscultation throughout lung fields with no wheezing, crackles, or rhonchi. No increased work of breathing  Abdomen: soft , no mass, non-tender, no rebound tenderness, no  hepatomegaly  Pelvic: deferred  Impression:   The primary encounter diagnosis was Encounter for gynecological examination (general) (routine) with abnormal findings. Diagnoses of Depression screening (Z13.31), Abnormal uterine bleeding (AUB), and Excessive or frequent menstruation were also pertinent to this visit.  Plan:   1.  Preoperative visit: D&C hysteroscopy, IUD insertion and possible endometrial polypectomy. Consents signed today.  -Risks of surgery were discussed with the patient including but not limited to: bleeding which may require transfusion; infection which may require antibiotics; injury to uterus or surrounding organs; intrauterine scarring which may impair future fertility; need for additional procedures including laparotomy or laparoscopy; and other postoperative/anesthesia complications. Written informed consent was obtained.  This is a scheduled same-day surgery. She will have a postop visit in 2 weeks to review operative findings and pathology.

## 2024-08-26 ENCOUNTER — Ambulatory Visit: Payer: Self-pay | Admitting: Urgent Care

## 2024-08-26 ENCOUNTER — Other Ambulatory Visit: Payer: Self-pay

## 2024-08-26 ENCOUNTER — Encounter
Admission: RE | Disposition: A | Discharge status: Home / Self Care | Payer: Self-pay | Source: Home / Self Care | Attending: Obstetrics and Gynecology

## 2024-08-26 ENCOUNTER — Ambulatory Visit: Admitting: Certified Registered"

## 2024-08-26 ENCOUNTER — Encounter: Payer: Self-pay | Admitting: Obstetrics and Gynecology

## 2024-08-26 ENCOUNTER — Ambulatory Visit
Admission: RE | Admit: 2024-08-26 | Discharge: 2024-08-26 | Disposition: A | Discharge status: Home / Self Care | Attending: Obstetrics and Gynecology | Admitting: Obstetrics and Gynecology

## 2024-08-26 DIAGNOSIS — K219 Gastro-esophageal reflux disease without esophagitis: Secondary | ICD-10-CM | POA: Insufficient documentation

## 2024-08-26 DIAGNOSIS — R9389 Abnormal findings on diagnostic imaging of other specified body structures: Secondary | ICD-10-CM | POA: Diagnosis present

## 2024-08-26 DIAGNOSIS — N939 Abnormal uterine and vaginal bleeding, unspecified: Secondary | ICD-10-CM | POA: Diagnosis present

## 2024-08-26 DIAGNOSIS — N840 Polyp of corpus uteri: Secondary | ICD-10-CM | POA: Diagnosis not present

## 2024-08-26 DIAGNOSIS — I10 Essential (primary) hypertension: Secondary | ICD-10-CM | POA: Diagnosis not present

## 2024-08-26 DIAGNOSIS — Z87891 Personal history of nicotine dependence: Secondary | ICD-10-CM | POA: Insufficient documentation

## 2024-08-26 LAB — POCT PREGNANCY, URINE: Preg Test, Ur: NEGATIVE

## 2024-08-26 SURGERY — DILATATION AND CURETTAGE /HYSTEROSCOPY
Anesthesia: General | Site: Cervix

## 2024-08-26 MED ORDER — DROPERIDOL 2.5 MG/ML IJ SOLN
INTRAMUSCULAR | Status: AC
  Filled 2024-08-26: qty 2

## 2024-08-26 MED ORDER — DEXAMETHASONE SOD PHOSPHATE PF 10 MG/ML IJ SOLN
INTRAMUSCULAR | Status: DC | PRN
  Administered 2024-08-26: 10 mg via INTRAVENOUS

## 2024-08-26 MED ORDER — MIDAZOLAM HCL (PF) 2 MG/2ML IJ SOLN
INTRAMUSCULAR | Status: DC | PRN
  Administered 2024-08-26: 2 mg via INTRAVENOUS

## 2024-08-26 MED ORDER — ACETAMINOPHEN 10 MG/ML IV SOLN
INTRAVENOUS | Status: AC
Start: 2024-08-26 — End: 2024-08-26
  Filled 2024-08-26: qty 100

## 2024-08-26 MED ORDER — ONDANSETRON HCL 4 MG/2ML IJ SOLN
INTRAMUSCULAR | Status: DC | PRN
  Administered 2024-08-26: 4 mg via INTRAVENOUS

## 2024-08-26 MED ORDER — DROPERIDOL 2.5 MG/ML IJ SOLN
0.6250 mg | Freq: Once | INTRAMUSCULAR | Status: AC
  Administered 2024-08-26: 0.625 mg via INTRAVENOUS

## 2024-08-26 MED ORDER — FENTANYL CITRATE (PF) 100 MCG/2ML IJ SOLN
INTRAMUSCULAR | Status: AC
  Filled 2024-08-26: qty 2

## 2024-08-26 MED ORDER — OXYCODONE HCL 5 MG PO TABS
5.0000 mg | ORAL_TABLET | Freq: Once | ORAL | Status: AC | PRN
  Administered 2024-08-26: 5 mg via ORAL

## 2024-08-26 MED ORDER — MIDAZOLAM HCL 2 MG/2ML IJ SOLN
INTRAMUSCULAR | Status: AC
  Filled 2024-08-26: qty 2

## 2024-08-26 MED ORDER — DOCUSATE SODIUM 100 MG PO CAPS: 100.0000 mg | ORAL_CAPSULE | Freq: Every day | ORAL | 0 refills | Status: DC | PRN

## 2024-08-26 MED ORDER — PROPOFOL 10 MG/ML IV BOLUS
INTRAVENOUS | Status: AC
  Filled 2024-08-26: qty 20

## 2024-08-26 MED ORDER — SODIUM CHLORIDE 0.9 % IR SOLN
Status: DC | PRN
  Administered 2024-08-26: 3000 mL

## 2024-08-26 MED ORDER — LEVONORGESTREL 20.1 MCG/DAY IU IUD: 1.0000 | INTRAUTERINE_SYSTEM | INTRAUTERINE | Status: DC

## 2024-08-26 MED ORDER — FENTANYL CITRATE (PF) 100 MCG/2ML IJ SOLN
25.0000 ug | INTRAMUSCULAR | Status: DC | PRN
  Administered 2024-08-26: 25 ug via INTRAVENOUS
  Administered 2024-08-26: 50 ug via INTRAVENOUS

## 2024-08-26 MED ORDER — ACETAMINOPHEN 10 MG/ML IV SOLN
INTRAVENOUS | Status: DC | PRN
  Administered 2024-08-26: 1000 mg via INTRAVENOUS

## 2024-08-26 MED ORDER — PROPOFOL 10 MG/ML IV BOLUS
INTRAVENOUS | Status: DC | PRN
  Administered 2024-08-26: 125 ug/kg/min via INTRAVENOUS
  Administered 2024-08-26: 80 mg via INTRAVENOUS
  Administered 2024-08-26: 100 mg via INTRAVENOUS
  Administered 2024-08-26: 40 mg via INTRAVENOUS

## 2024-08-26 MED ORDER — PROPOFOL 1000 MG/100ML IV EMUL
INTRAVENOUS | Status: AC
  Filled 2024-08-26: qty 100

## 2024-08-26 MED ORDER — CHLORHEXIDINE GLUCONATE 0.12 % MT SOLN
15.0000 mL | Freq: Once | OROMUCOSAL | Status: AC
  Administered 2024-08-26: 15 mL via OROMUCOSAL

## 2024-08-26 MED ORDER — KETOROLAC TROMETHAMINE 30 MG/ML IJ SOLN
INTRAMUSCULAR | Status: DC | PRN
  Administered 2024-08-26: 15 mg via INTRAVENOUS

## 2024-08-26 MED ORDER — LACTATED RINGERS IV SOLN: INTRAVENOUS | Status: DC

## 2024-08-26 MED ORDER — OXYCODONE HCL 5 MG PO TABS
ORAL_TABLET | ORAL | Status: AC
  Filled 2024-08-26: qty 1

## 2024-08-26 MED ORDER — LIDOCAINE HCL (CARDIAC) PF 100 MG/5ML IV SOSY
PREFILLED_SYRINGE | INTRAVENOUS | Status: DC | PRN
  Administered 2024-08-26: 100 mg via INTRAVENOUS

## 2024-08-26 MED ORDER — ORAL CARE MOUTH RINSE: 15.0000 mL | Freq: Once | OROMUCOSAL | Status: AC

## 2024-08-26 MED ORDER — POVIDONE-IODINE 10 % EX SWAB: 2.0000 | Freq: Once | CUTANEOUS | Status: DC

## 2024-08-26 MED ORDER — IBUPROFEN 800 MG PO TABS: 800.0000 mg | ORAL_TABLET | Freq: Three times a day (TID) | ORAL | 1 refills | Status: DC | PRN

## 2024-08-26 MED ORDER — KETOROLAC TROMETHAMINE 30 MG/ML IJ SOLN
INTRAMUSCULAR | Status: AC
  Filled 2024-08-26: qty 1

## 2024-08-26 MED ORDER — CHLORHEXIDINE GLUCONATE 0.12 % MT SOLN
OROMUCOSAL | Status: AC
  Filled 2024-08-26: qty 15

## 2024-08-26 MED ORDER — OXYCODONE HCL 5 MG/5ML PO SOLN: 5.0000 mg | Freq: Once | ORAL | Status: AC | PRN

## 2024-08-26 MED ORDER — DEXMEDETOMIDINE HCL IN NACL 80 MCG/20ML IV SOLN
INTRAVENOUS | Status: DC | PRN
  Administered 2024-08-26 (×2): 8 ug via INTRAVENOUS

## 2024-08-26 SURGICAL SUPPLY — 23 items
BAG PRESSURE INF REUSE 1000 (BAG) ×1 IMPLANT
BASIN KIT SINGLE STR (MISCELLANEOUS) ×1 IMPLANT
DEVICE MYOSURE LITE (MISCELLANEOUS) IMPLANT
DEVICE MYOSURE REACH (MISCELLANEOUS) IMPLANT
DRSG TELFA 3X8 NADH STRL (GAUZE/BANDAGES/DRESSINGS) IMPLANT
ELECTRODE REM PT RTRN 9FT ADLT (ELECTROSURGICAL) ×1 IMPLANT
GLOVE BIO SURGEON STRL SZ7 (GLOVE) ×1 IMPLANT
GLOVE INDICATOR 7.5 STRL GRN (GLOVE) ×1 IMPLANT
GOWN STRL REUS W/ TWL LRG LVL3 (GOWN DISPOSABLE) ×2 IMPLANT
KIT PROCED FLUENT PRO FLT212S (KITS) ×1 IMPLANT
KIT TURNOVER CYSTO (KITS) ×1 IMPLANT
MANIFOLD NEPTUNE II (INSTRUMENTS) ×1 IMPLANT
PACK DNC HYST (MISCELLANEOUS) ×1 IMPLANT
PAD PREP OB/GYN DISP 24X41 (PERSONAL CARE ITEMS) ×1 IMPLANT
SCRUB CHG 4% DYNA-HEX 4OZ (MISCELLANEOUS) ×1 IMPLANT
SEAL ROD LENS SCOPE MYOSURE (ABLATOR) IMPLANT
SET CYSTO IRRIGATION (SET/KITS/TRAYS/PACK) IMPLANT
SOL .9 NS 3000ML IRR UROMATIC (IV SOLUTION) ×1 IMPLANT
SOLUTION PREP PVP 2OZ (MISCELLANEOUS) ×1 IMPLANT
TOWEL OR 17X26 4PK STRL BLUE (TOWEL DISPOSABLE) ×1 IMPLANT
TRAP FLUID SMOKE EVACUATOR (MISCELLANEOUS) ×1 IMPLANT
TUBING CONNECTING 10 (TUBING) ×1 IMPLANT
WATER STERILE IRR 500ML POUR (IV SOLUTION) ×1 IMPLANT

## 2024-08-26 NOTE — Anesthesia Procedure Notes (Signed)
 Procedure Name: LMA Insertion Date/Time: 08/26/2024 10:52 AM  Performed by: Norleen Alberta HERO., CRNAPre-anesthesia Checklist: Patient identified, Patient being monitored, Timeout performed, Emergency Drugs available and Suction available Patient Re-evaluated:Patient Re-evaluated prior to induction Oxygen Delivery Method: Circle system utilized Preoxygenation: Pre-oxygenation with 100% oxygen Induction Type: IV induction Ventilation: Mask ventilation without difficulty LMA: LMA inserted LMA Size: 3.0 Tube type: Oral Number of attempts: 1 Placement Confirmation: positive ETCO2 and breath sounds checked- equal and bilateral Tube secured with: Tape Dental Injury: Teeth and Oropharynx as per pre-operative assessment

## 2024-08-26 NOTE — Anesthesia Postprocedure Evaluation (Signed)
 Anesthesia Post Note  Patient: LENKA ZHAO  Procedure(s) Performed: DILATATION AND CURETTAGE /HYSTEROSCOPY (Cervix) POLYPECTOMY, CERVIX (Cervix)  Patient location during evaluation: PACU Anesthesia Type: General Level of consciousness: awake and alert Pain management: pain level controlled Vital Signs Assessment: post-procedure vital signs reviewed and stable Respiratory status: spontaneous breathing, nonlabored ventilation, respiratory function stable and patient connected to nasal cannula oxygen Cardiovascular status: blood pressure returned to baseline and stable Postop Assessment: no apparent nausea or vomiting Anesthetic complications: no   No notable events documented.   Last Vitals:  Vitals:   08/26/24 1230 08/26/24 1248  BP: 131/81 104/61  Pulse: 67 74  Resp: 16 15  Temp:  36.5 C  SpO2: 96% 97%    Last Pain:  Vitals:   08/26/24 1248  TempSrc: Temporal  PainSc: 0-No pain                 Lendia LITTIE Mae

## 2024-08-26 NOTE — Discharge Instructions (Signed)
 Discharge instructions after a hysteroscopy with dilation and curettage  Signs and Symptoms to Report  Call our office at [phone] if you have any of the following:    Fever over 100.4 degrees or higher  Severe stomach pain not relieved with pain medications  Bright red bleeding that's heavier than a period that does not slow with rest after the first 24 hours  To go the bathroom a lot (frequency), you can't hold your urine (urgency), or it hurts when you empty your bladder (urinate)  Chest pain  Shortness of breath  Pain in the calves of your legs  Severe nausea and vomiting not relieved with anti-nausea medications  Any concerns  What You Can Expect after Surgery  You may see some pink tinged, bloody fluid. This is normal. You may also have cramping for several days.   Activities after Your Discharge Follow these guidelines to help speed your recovery at home:  Don't drive if you are in pain or taking narcotic pain medicine. You may drive when you can safely slam on the brakes, turn the wheel forcefully, and rotate your torso comfortably. This is typically 4-7 days. Practice in a parking lot or side street prior to attempting to drive regularly.   Ask others to help with household chores for 4 weeks.  Don't do strenuous activities, exercises, or sports like vacuuming, tennis, squash, etc. until your doctor says it is safe to do so.  Walk as you feel able. Rest often since it may take a week or two for your energy level to return to normal.   You may climb stairs  Avoid constipation:   -Eat fruits, vegetables, and whole grains. Eat small meals as your appetite will take time to return to normal.   -Drink 6 to 8 glasses of water each day unless your doctor has told you to limit your fluids.   -Use a laxative or stool softener as needed if constipation becomes a problem. You may take Miralax, metamucil, Citrucil, Colace, Senekot, FiberCon, etc. If this does not relieve the  constipation, try two tablespoons of Milk Of Magnesia every 8 hours until your bowels move.   You may shower.   Do not get in a hot tub, swimming pool, etc. until your doctor agrees.  Do not douche, use tampons, or have sex until your doctor says it is okay, usually about 2 weeks.  Take your pain medicine when you need it. The medicine may not work as well if the pain is bad.  Take the medicines you were taking before surgery. Other medications you might need are pain medications (ibuprofen), medications for constipation (Colace) and nausea medications (Zofran).

## 2024-08-26 NOTE — Interval H&P Note (Signed)
 History and Physical Interval Note:  08/26/2024 10:29 AM  Desiree Santos  has presented today for surgery, with the diagnosis of Thickened endometrium  Abnormal uterine bleeding.  The various methods of treatment have been discussed with the patient and family. After consideration of risks, benefits and other options for treatment, the patient has consented to  Procedure(s) with comments: DILATATION AND CURETTAGE /HYSTEROSCOPY (N/A) POLYPECTOMY, CERVIX (N/A) INSERTION, INTRAUTERINE DEVICE (N/A) - Mirena as a surgical intervention.  The patient's history has been reviewed, patient examined, no change in status, stable for surgery.  I have reviewed the patient's chart and labs.  Questions were answered to the patient's satisfaction.     Heather Penton

## 2024-08-26 NOTE — Anesthesia Preprocedure Evaluation (Addendum)
 Anesthesia Evaluation  Patient identified by MRN, date of birth, ID band Patient awake    Reviewed: Allergy & Precautions, NPO status , Patient's Chart, lab work & pertinent test results  History of Anesthesia Complications Negative for: history of anesthetic complications  Airway Mallampati: III  TM Distance: >3 FB Neck ROM: full    Dental no notable dental hx.    Pulmonary former smoker   Pulmonary exam normal        Cardiovascular hypertension, Normal cardiovascular exam     Neuro/Psych  Headaches PSYCHIATRIC DISORDERS Anxiety Depression     Neuromuscular disease    GI/Hepatic Neg liver ROS,GERD  Medicated,,  Endo/Other  diabetesHypothyroidism    Renal/GU      Musculoskeletal   Abdominal   Peds  Hematology  (+) Blood dyscrasia, anemia   Anesthesia Other Findings Past Medical History: No date: Abnormal uterine bleeding No date: Anemia No date: Chicken pox 05/26/2023: COVID-19 No date: Depression No date: Elevated BP without diagnosis of hypertension No date: GERD (gastroesophageal reflux disease) No date: Gestational diabetes No date: Headache No date: History of kidney stones No date: History of UTI No date: Hypertension No date: Hypothyroidism No date: Thyroid  disease  Past Surgical History: No date: CHOLECYSTECTOMY 05/06/2023: DILATION AND CURETTAGE OF UTERUS; N/A     Comment:  Procedure: SUCTION DILATATION AND CURETTAGE;  Surgeon:               Verdon Keen, MD;  Location: [facility];  Service:               Gynecology;  Laterality: N/A; 2002: KIDNEY SURGERY     Comment:  h/o kidney reflux? 06/08/2023: LAPAROSCOPIC TUBAL LIGATION; Bilateral     Comment:  Procedure: LAPAROSCOPIC TUBAL LIGATION;  Surgeon:               Verdon Keen, MD;  Location: [facility];  Service:               Gynecology;  Laterality: Bilateral; No date: TONSILLECTOMY 10/20/2022: WISDOM TOOTH EXTRACTION; Right      Reproductive/Obstetrics negative OB ROS                              Anesthesia Physical Anesthesia Plan  ASA: 3  Anesthesia Plan: General LMA   Post-op Pain Management: Ofirmev  IV (intra-op)* and Toradol  IV (intra-op)*   Induction: Intravenous  PONV Risk Score and Plan: 3 and Dexamethasone , Ondansetron , Midazolam  and Treatment may vary due to age or medical condition  Airway Management Planned: LMA  Additional Equipment:   Intra-op Plan:   Post-operative Plan: Extubation in OR  Informed Consent: I have reviewed the patients History and Physical, chart, labs and discussed the procedure including the risks, benefits and alternatives for the proposed anesthesia with the patient or authorized representative who has indicated his/her understanding and acceptance.     Dental Advisory Given  Plan Discussed with: Anesthesiologist, CRNA and Surgeon  Anesthesia Plan Comments: (Patient consented for risks of anesthesia including but not limited to:  - adverse reactions to medications - risk of airway placement if required - damage to eyes, teeth, lips or other oral mucosa - nerve damage due to positioning  - sore throat or hoarseness - Damage to heart, brain, nerves, lungs, other parts of body or loss of life  Patient voiced understanding and assent.)         Anesthesia Quick Evaluation

## 2024-08-26 NOTE — Op Note (Signed)
 Operative Report Hysteroscopy with Dilation and Curettage   Indications: Abnormal uterine bleeding   Pre-operative Diagnosis: Endometrial polyp   Post-operative Diagnosis: same.  Procedure: 1. Exam under anesthesia 2. Fractional D&C 3. Hysteroscopy 4. Polypectomy  Surgeon: Heather Penton, MD  Assistant(s):  None  Anesthesia: General LMA anesthesia  Anesthesiologist: Chesley Lendia CROME, MD Anesthesiologist: Chesley Lendia CROME, MD CRNA: Norleen Alberta HERO., CRNA; Dyane Mass, CRNA  Estimated Blood Loss:  25ml  Total IV Fluids: see anesthesia's report  Urine Output: 50ml  Total Fluid Deficit:  See operative report         Specimens: Endocervical curettings, endometrial curettings, Myosure polyp         Complications:  None; patient tolerated the procedure well.         Disposition: PACU - hemodynamically stable.         Condition: stable  Findings: Uterus measuring 11 cm by sound; normal cervix, vagina, perineum. Proliferative endometrial tissue with anterior polyp/fibroid. Both tubal ostia visualized.  Indication for procedure/Consents: 38 y.o. F here for scheduled surgery for the aforementioned diagnoses.     Risks of surgery were discussed with the patient including but not limited to: bleeding which may require transfusion; infection which may require antibiotics; injury to uterus or surrounding organs; intrauterine scarring which may impair future fertility; need for additional procedures including laparotomy or laparoscopy; and other postoperative/anesthesia complications. Written informed consent was obtained.    Procedure Details:   D&C/ Myosure  The patient was taken to the operating room where anesthesia was administered and was found to be adequate.  After a formal and adequate timeout was performed, she was placed in the dorsal lithotomy position and examined with the above findings. She was then prepped and draped in the sterile manner.   Her bladder was  catheterized for an estimated amount of clear, yellow urine. A weighed speculum was then placed in the patient's vagina and a single tooth tenaculum was applied to the anterior lip of the cervix.  An endocervical curette was performed. Her cervix was serially dilated to 15 French using Hanks dilators. The hysteroscope was introduced under direct observation  Using lactated ringers  as a distention medium to reveal the above findings. The uterine cavity was carefully examined, both ostia were recognized, and diffusely proliferative endometrium with the polyp above were noted.   This was resected using the Myosure device.  After further careful visualization of the uterine cavity, the hysteroscope was removed under direct visualization.  A sharp curettage was then performed until there was a gritty texture in all four quadrants.  The tenaculum was removed from the anterior lip of the cervix and the vaginal speculum was removed after applying silver  nitrate/pressure for good hemostasis.   The patient tolerated the procedure well and was taken to the recovery area awake and in stable condition. She received iv acetaminophen  and Toradol  prior to leaving the OR.  The patient will be discharged to home as per PACU criteria. Routine postoperative instructions given.  She was prescribed Ibuprofen  and Colace.  She will follow up in the clinic in two weeks for postoperative evaluation.

## 2024-08-26 NOTE — Transfer of Care (Signed)
 Immediate Anesthesia Transfer of Care Note  Patient: Desiree Santos  Procedure(s) Performed: DILATATION AND CURETTAGE /HYSTEROSCOPY (Cervix) POLYPECTOMY, CERVIX (Cervix)  Patient Location: PACU  Anesthesia Type:General  Level of Consciousness: patient cooperative  Airway & Oxygen Therapy: Patient Spontanous Breathing and Patient connected to nasal cannula oxygen  Post-op Assessment: Report given to RN and Post -op Vital signs reviewed and stable  Post vital signs: stable  Last Vitals:  Vitals Value Taken Time  BP 128/76 08/26/24 11:37  Temp    Pulse 89 08/26/24 11:38  Resp 13 08/26/24 11:38  SpO2 100 % 08/26/24 11:38  Vitals shown include unfiled device data.  Last Pain:  Vitals:   08/26/24 1008  TempSrc: Temporal  PainSc: 0-No pain         Complications: No notable events documented.

## 2024-08-27 ENCOUNTER — Encounter: Payer: Self-pay | Admitting: Obstetrics and Gynecology

## 2024-08-29 LAB — SURGICAL PATHOLOGY

## 2024-08-30 ENCOUNTER — Ambulatory Visit: Payer: Self-pay | Admitting: Family

## 2024-08-30 NOTE — Progress Notes (Signed)
 noted

## 2024-09-13 ENCOUNTER — Other Ambulatory Visit: Payer: Self-pay | Admitting: Family

## 2024-09-13 DIAGNOSIS — E039 Hypothyroidism, unspecified: Secondary | ICD-10-CM

## 2024-10-04 NOTE — Progress Notes (Unsigned)
 10/04/24 11:11 AM   Desiree Santos 08-22-1986 [id number]   HPI: 38 y.o. female here for initial evaluation of ***  Hx of a pediatric urologic surgery***    PMH: Past Medical History:  Diagnosis Date   Abnormal uterine bleeding    Anemia    Chicken pox    COVID-19 05/26/2023   Depression    Elevated BP without diagnosis of hypertension    GERD (gastroesophageal reflux disease)    Gestational diabetes    Headache    History of kidney stones    History of UTI    Hypertension    Hypothyroidism    Thyroid  disease     Surgical History: Past Surgical History:  Procedure Laterality Date   CERVICAL POLYPECTOMY N/A 08/26/2024   Procedure: POLYPECTOMY, CERVIX;  Surgeon: Verdon Keen, MD;  Location: [facility];  Service: Gynecology;  Laterality: N/A;   CHOLECYSTECTOMY     DILATION AND CURETTAGE OF UTERUS N/A 05/06/2023   Procedure: SUCTION DILATATION AND CURETTAGE;  Surgeon: Verdon Keen, MD;  Location: [facility];  Service: Gynecology;  Laterality: N/A;   HYSTEROSCOPY WITH D & C N/A 08/26/2024   Procedure: DILATATION AND CURETTAGE /HYSTEROSCOPY;  Surgeon: Verdon Keen, MD;  Location: [facility];  Service: Gynecology;  Laterality: N/A;   KIDNEY SURGERY  2002   h/o kidney reflux?   LAPAROSCOPIC TUBAL LIGATION Bilateral 06/08/2023   Procedure: LAPAROSCOPIC TUBAL LIGATION;  Surgeon: Verdon Keen, MD;  Location: [facility];  Service: Gynecology;  Laterality: Bilateral;   TONSILLECTOMY     WISDOM TOOTH EXTRACTION Right 10/20/2022    Family History: Family History  Problem Relation Age of Onset   Hyperlipidemia Mother    Hypertension Mother    Heart disease Father    Sudden Cardiac Death Father        age 64   Arthritis Maternal Grandmother    Cancer Maternal Grandmother    Depression Maternal Grandmother    Hypertension Maternal Grandmother    Dementia Maternal Grandmother    Hypertension Maternal Grandfather    Early death Paternal Grandmother    Cancer  Paternal Grandmother    Breast cancer Paternal Grandmother        thinks in her 56's   Diabetes Paternal Grandfather    Cancer Paternal Grandfather    Hypertension Paternal Grandfather    Hyperlipidemia Paternal Grandfather    Heart disease Paternal Grandfather    Early death Paternal Grandfather    Depression Paternal Grandfather     Social History:  reports that she quit smoking about 3 years ago. Her smoking use included cigarettes. She has never used smokeless tobacco. She reports that she does not drink alcohol and does not use drugs.      Physical Exam: There were no vitals taken for this visit.   Constitutional:  Alert and oriented, No acute distress. Cardiovascular: No clubbing, cyanosis, or edema. Respiratory: Normal respiratory effort, no increased work of breathing. GI: Nondistended Skin: No rashes, bruises or suspicious lesions. Neurologic: Grossly intact, no focal deficits, moving all 4 extremities. Psychiatric: Normal mood and affect.  Laboratory Data:  Latest Reference Range & Units 08/24/24 14:40  Creatinine 0.44 - 1.00 mg/dL 9.59 (L)  (L): Data is abnormally low   Pertinent Imaging: I have personally viewed and interpreted the CT A/P (Nov 2024) -agree with read, morphologically normal bilateral kidneys, no hydronephrosis, no nephrolithiasis.  Bilateral ureters and bladder unremarkable.  Adrenal glands are unremarkable. Kidneys enhance symmetrically. No no hydronephrosis. No obstructing nephrolithiasis. Bladder  is decompressed..    Assessment & Plan:    There are no diagnoses linked to this encounter.    Penne Skye, MD 10/04/2024  [facility] [address] [place], [place] [address] [phone]

## 2024-10-05 ENCOUNTER — Ambulatory Visit: Admitting: Urology

## 2024-10-05 VITALS — BP 149/85 | HR 95 | Wt 238.0 lb

## 2024-10-05 DIAGNOSIS — N1370 Vesicoureteral-reflux, unspecified: Secondary | ICD-10-CM | POA: Insufficient documentation

## 2024-10-05 NOTE — Assessment & Plan Note (Addendum)
 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

## 2024-10-21 ENCOUNTER — Other Ambulatory Visit: Payer: Self-pay | Admitting: Family

## 2024-10-21 DIAGNOSIS — K219 Gastro-esophageal reflux disease without esophagitis: Secondary | ICD-10-CM

## 2024-11-02 ENCOUNTER — Other Ambulatory Visit: Payer: Self-pay

## 2024-11-11 NOTE — H&P (Signed)
 Desiree Santos is a 39 year old female with endometrial hyperplasia without atypia who presents for a preoperative consultation for a robotically assisted total laparoscopic hysterectomy.     She has a hx of thickened ES polyp and AUB, with a D&C finding focal endometrial hyperplasia without atypia   She does have a hx of ureter reimplantation as a child. She has seen urology and they will come for retrograde ureteral ICG   Pap: 10/2022 Neg/neg  Pertinent hx: Hx of lap chole Hx of lap BTL Hx of cervical polypectomy, D&C    Pathology 08/2024:  1. Endocervix, curettage,  :       -  UNREMARKABLE STRIPS OF ENDOCERVICAL MUCOSA, NEGATIVE FOR DYSPLASIA.        2. Endometrium, curettage,  :       -  ENDOMETRIAL POLYP WITH FOCAL ENDOMETRIAL HYPERPLASIA WITHOUT ATYPIA AND       EXTENSIVE SQUAMOUS METAPLASIA, NEGATIVE FOR EIN/MALIGNANCY.       NOTE: DR. LU HAS PEER REVIEWED THE CASE AND AGREES WITH THE INTERPRETATION.        3. Endometrial polyp,  :       -  BENIGN DISORDERED PROLIFERATIVE PHASE ENDOMETRIUM, NEGATIVE FOR       ATYPIA/HYPERPLASIA.    Past Medical History:  has a past medical history of Anxiety, Depression, GERD (gastroesophageal reflux disease), Hyperlipidemia, Hypertension, and Thyroid  disease.  Past Surgical History:  has a past surgical history that includes Cholecystectomy (2004); Replacement of ureter (Bilateral, 1999); dilation & currettage (05/06/2023); Laparoscopic tubal ligation; DILATATION AND CURETTAGE /HYSTEROSCOPY (08/26/2024); and POLYPECTOMY, CERVIX (08/26/2024). Family History: family history includes Breast cancer (age of onset: 26) in her paternal grandmother; Cancer in her maternal grandmother; Colon cancer (age of onset: 67) in her paternal grandmother; Depression in her maternal grandmother; High blood pressure (Hypertension) in her mother; Hyperlipidemia (Elevated cholesterol) in her maternal grandfather, maternal grandmother, and mother; Lung cancer (age of  onset: 49) in her paternal grandmother; Myocardial Infarction (Heart attack) (age of onset: 39) in her father; No Known Problems in her son; Sudden cardiac death (age of onset: 8) in her father; Thyroid  disease in her maternal grandfather and sister. Social History:  reports that she quit smoking about 8 years ago. Her smoking use included cigarettes. She started smoking about 10 years ago. She has never used smokeless tobacco. She reports that she does not currently use alcohol. She reports that she does not use drugs. OB/GYN History:  OB History       Gravida  2   Para  2   Term  2   Preterm      AB      Living  2        SAB      IAB      Ectopic      Molar      Multiple      Live Births  2             Allergies: has no known allergies. Medications:  Current Medications    Current Outpatient Medications:    cetirizine  (ZYRTEC ) 10 MG tablet, , Disp: , Rfl:    FLUoxetine (PROZAC) 40 MG capsule, , Disp: , Rfl:    levothyroxine  (SYNTHROID ) 100 MCG tablet, Take 1 tablet (100 mcg total) by mouth once daily, Disp: 90 tablet, Rfl: 3   levothyroxine  (SYNTHROID ) 75 MCG tablet, Take 1 tablet (75 mcg total) by mouth once daily Take on an empty stomach  with a glass of water at least 30-60 minutes before breakfast., Disp: 90 tablet, Rfl: 3   omeprazole  (PRILOSEC) 20 MG DR capsule, Take 20 mg by mouth once daily, Disp: , Rfl:    cyanocobalamin  (VITAMIN B12) 1,000 mcg/mL injection, Inject 1 mL (1,000 mcg total) into the muscle once a week (Patient not taking: Reported on 07/28/2024), Disp: 1 mL, Rfl: 3   docusate (COLACE) 100 MG capsule, Take 100 mg by mouth 2 (two) times daily (Patient not taking: Reported on 11/08/2024), Disp: , Rfl:    ibuprofen  (MOTRIN ) 800 MG tablet, TAKE 1 TABLET BY MOUTH EVERY 8 (EIGHT) HOURS FOR 3 DAYS. AFTER 72HRS, MAY TAKE AS NEEDED (Patient not taking: Reported on 11/08/2024), Disp: , Rfl:    labetaloL  (TRANDATE ) 200 MG tablet, Take 2 tablets (400 mg) by  mouth in the morning with breakfast, then 1 tablet (200 mg) by mouth at lunch, then 2 tablets (400 mg) at bedtime for 30 days (Patient not taking: Reported on 07/28/2024), Disp: 150 tablet, Rfl: 0   oxyCODONE  (ROXICODONE ) 5 MG immediate release tablet, Take 5 mg by mouth every 4 (four) hours as needed (Patient not taking: Reported on 11/08/2024), Disp: , Rfl:    promethazine -dextromethorphan (PROMETHAZINE -DM) 6.25-15 mg/5 mL syrup, Take 5 mLs by mouth every 6 (six) hours as needed for Cough (Patient not taking: Reported on 10/05/2023), Disp: 120 mL, Rfl: 0   sertraline  (ZOLOFT ) 50 MG tablet, Take 50 mg by mouth once daily (Patient not taking: Reported on 07/28/2024), Disp: , Rfl:    Current Facility-Administered Medications:    cyanocobalamin  (VITAMIN B12) injection 1,000 mcg, 1,000 mcg, Intramuscular, Q7 Days, Vernel Therisa Browning, CNM, 1,000 mcg at 04/09/23 1016     Review of Systems: No SOB, no palpitations or chest pain, no new lower extremity edema, no nausea or vomiting or bowel or bladder complaints. See HPI for gyn specific ROS.    Exam:    Objective BP 124/84 (BP Location: Left upper arm, Patient Position: Sitting, BP Cuff Size: Large Adult)   Pulse 75   Ht 157.5 cm (5' 2)   Wt (!) 108.4 kg (239 lb)   Breastfeeding No   BMI 43.71 kg/m    General: Patient is well-groomed, well-nourished, appears stated age in no acute distress   HEENT: head is atraumatic and normocephalic, trachea is midline, neck is supple with no palpable nodules   CV: Regular rhythm and normal heart rate, no murmur   Pulm: Clear to auscultation throughout lung fields with no wheezing, crackles, or rhonchi. No increased work of breathing   Abdomen: soft , no mass, non-tender, no rebound tenderness, no hepatomegaly   Pelvic:deferred           Impression:    The primary encounter diagnosis was Endometrial hyperplasia without atypia, simple. A diagnosis of Abnormal uterine bleeding (AUB) was also  pertinent to this visit.       Plan:    Patient returns for a preoperative discussion regarding her plans to proceed with definitive surgical treatment of her hyperplasia and AUB by  robotic assisted total laparoscopic hysterectomy with bilateral salpingectomy.    Urology first for ICG instillation. Out patient visit they were not concerned Body mass index is 43.71 kg/m.     The patient and I discussed the technical aspects of the procedure including the potential for risks and complications.  These include but are not limited to the risk of infection requiring post-operative antibiotics or further procedures.  We talked about the  risk of injury to adjacent organs including bladder, bowel, ureter, blood vessels or nerves.  We talked about the need to convert to an open incision.  We talked about the possible need for blood transfusion.  We talked about postop complications such as thromboembolic or cardiopulmonary complications.  All of her questions were answered.  Her preoperative exam was completed and the appropriate consents were signed. She is scheduled to undergo this procedure in the near future.

## 2024-11-18 ENCOUNTER — Encounter
Admission: RE | Admit: 2024-11-18 | Discharge: 2024-11-18 | Disposition: A | Source: Ambulatory Visit | Attending: Obstetrics and Gynecology | Admitting: Obstetrics and Gynecology

## 2024-11-18 ENCOUNTER — Other Ambulatory Visit: Payer: Self-pay

## 2024-11-18 DIAGNOSIS — Z01812 Encounter for preprocedural laboratory examination: Secondary | ICD-10-CM

## 2024-11-18 DIAGNOSIS — N8500 Endometrial hyperplasia, unspecified: Secondary | ICD-10-CM

## 2024-11-18 HISTORY — DX: Endometrial hyperplasia, unspecified: N85.00

## 2024-11-18 HISTORY — DX: Abnormal uterine and vaginal bleeding, unspecified: N93.9

## 2024-11-18 NOTE — Patient Instructions (Addendum)
 Your procedure is scheduled on: 11/25/24 - Friday Report to the Registration Desk on the 1st floor of the [facility]. To find out your arrival time, please call [phone] between 1PM - 3PM on: 11/24/24 - Thursday If your arrival time is 6:00 am, do not arrive before that time as the [facility] entrance doors do not open until 6:00 am.  REMEMBER: Instructions that are not followed completely may result in serious medical risk, up to and including death; or upon the discretion of your surgeon and anesthesiologist your surgery may need to be rescheduled.  Do not eat food after midnight the night before surgery.  No gum chewing or hard candies.  You may however, drink CLEAR liquids up to 2 hours before you are scheduled to arrive for your surgery. Do not drink anything within 2 hours of your scheduled arrival time.  Clear liquids include: - water  - apple juice without pulp - gatorade (not RED colors) - black coffee or tea (Do NOT add milk or creamers to the coffee or tea) Do NOT drink anything that is not on this list.  One week prior to surgery: Stop Anti-inflammatories (NSAIDS) such as Advil , Aleve, Ibuprofen , Motrin , Naproxen, Naprosyn and Aspirin based products such as Excedrin, Goody's Powder, BC Powder.You may take Tylenol  if needed for pain up until the day of surgery.  Stop ANY OVER THE COUNTER supplements until after surgery.  ON THE DAY OF SURGERY ONLY TAKE THESE MEDICATIONS WITH SIPS OF WATER:  levothyroxine  (SYNTHROID )  sertraline  (ZOLOFT )    No Alcohol for 24 hours before or after surgery.  No Smoking including e-cigarettes for 24 hours before surgery.  No chewable tobacco products for at least 6 hours before surgery.  No nicotine patches on the day of surgery.  Do not use any recreational drugs for at least a week (preferably 2 weeks) before your surgery.  Please be advised that the combination of cocaine and anesthesia may have negative outcomes, up to and  including death. If you test positive for cocaine, your surgery will be cancelled.  On the morning of surgery brush your teeth with toothpaste and water, you may rinse your mouth with mouthwash if you wish. Do not swallow any toothpaste or mouthwash.  Use CHG Soap or wipes as directed on instruction sheet.  Do not wear jewelry, make-up, hairpins, clips or nail polish.  For welded (permanent) jewelry: bracelets, anklets, waist bands, etc.  Please have this removed prior to surgery.  If it is not removed, there is a chance that hospital personnel will need to cut it off on the day of surgery.  Do not wear lotions, powders, or perfumes.   Do not shave body hair from the neck down 48 hours before surgery.  Contact lenses, hearing aids and dentures may not be worn into surgery.  Do not bring valuables to the hospital. [facility] is not responsible for any missing/lost belongings or valuables.   Notify your doctor if there is any change in your medical condition (cold, fever, infection).  Wear comfortable clothing (specific to your surgery type) to the hospital.  After surgery, you can help prevent lung complications by doing breathing exercises.  Take deep breaths and cough every 1-2 hours. Your doctor may order a device called an Incentive Spirometer to help you take deep breaths.  When coughing or sneezing, hold a pillow firmly against your incision with both hands. This is called splinting. Doing this helps protect your incision. It also decreases  belly discomfort.  If you are being admitted to the hospital overnight, leave your suitcase in the car. After surgery it may be brought to your room.  In case of increased patient census, it may be necessary for you, the patient, to continue your postoperative care in the Same Day Surgery department.  If you are being discharged the day of surgery, you will not be allowed to drive home. You will need a responsible individual to drive you  home and stay with you for 24 hours after surgery.   If you are taking public transportation, you will need to have a responsible individual with you.  Please call the Pre-admissions Testing Dept. at [phone] if you have any questions about these instructions.  Surgery Visitation Policy:  Patients having surgery or a procedure may have two visitors.  Children under the age of 48 must have an adult with them who is not the patient.  Inpatient Visitation:    Visiting hours are 7 a.m. to 8 p.m. Up to four visitors are allowed at one time in a patient room. The visitors may rotate out with other people during the day.  One visitor age 28 or older may stay with the patient overnight and must be in the room by 8 p.m.   Merchandiser, Retail to address health-related social needs:  [email or web address]                                                                                                            Preparing for Surgery with CHLORHEXIDINE  GLUCONATE (CHG) Soap  Chlorhexidine  Gluconate (CHG) Soap  o An antiseptic cleaner that kills germs and bonds with the skin to continue killing germs even after washing  o Used for showering the night before surgery and morning of surgery  Before surgery, you can play an important role by reducing the number of germs on your skin.  CHG (Chlorhexidine  gluconate) soap is an antiseptic cleanser which kills germs and bonds with the skin to continue killing germs even after washing.  Please do not use if you have an allergy to CHG or antibacterial soaps. If your skin becomes reddened/irritated stop using the CHG.  1. Shower the NIGHT BEFORE SURGERY with CHG soap.  2. If you choose to wash your hair, wash your hair first as usual with your normal shampoo.  3. After shampooing, rinse your hair and body thoroughly to remove the shampoo.  4. Use CHG as you would any other liquid soap. You can apply CHG directly to the skin  and wash gently with a clean washcloth.  5. Apply the CHG soap to your body only from the neck down. Do not use on open wounds or open sores. Avoid contact with your eyes, ears, mouth, and genitals (private parts). Wash face and genitals (private parts) with your normal soap.  6. Wash thoroughly, paying special attention to the area where your surgery will be performed.  7. Thoroughly rinse your body with warm water.  8. Do not shower/wash with your normal soap after  using and rinsing off the CHG soap.  9. Do not use lotions, oils, etc., after showering with CHG.  10. Pat yourself dry with a clean towel.  11. Wear clean pajamas to bed the night before surgery.  12. Place clean sheets on your bed the night of your shower and do not sleep with pets.  13. Do not apply any deodorants/lotions/powders.  14. Please wear clean clothes to the hospital.  15. Remember to brush your teeth with your regular toothpaste.

## 2024-11-23 ENCOUNTER — Encounter
Admission: RE | Admit: 2024-11-23 | Discharge: 2024-11-23 | Disposition: A | Discharge status: Home / Self Care | Source: Ambulatory Visit | Attending: Obstetrics and Gynecology | Admitting: Obstetrics and Gynecology

## 2024-11-23 DIAGNOSIS — N8500 Endometrial hyperplasia, unspecified: Secondary | ICD-10-CM | POA: Diagnosis not present

## 2024-11-23 DIAGNOSIS — Z01818 Encounter for other preprocedural examination: Secondary | ICD-10-CM | POA: Diagnosis present

## 2024-11-23 DIAGNOSIS — Z01812 Encounter for preprocedural laboratory examination: Secondary | ICD-10-CM | POA: Diagnosis not present

## 2024-11-23 LAB — CBC
HCT: 40 % (ref 36.0–46.0)
Hemoglobin: 13 g/dL (ref 12.0–15.0)
MCH: 27.3 pg (ref 26.0–34.0)
MCHC: 32.5 g/dL (ref 30.0–36.0)
MCV: 84 fL (ref 80.0–100.0)
Platelets: 325 10*3/uL (ref 150–400)
RBC: 4.76 MIL/uL (ref 3.87–5.11)
RDW: 13.9 % (ref 11.5–15.5)
WBC: 9.8 10*3/uL (ref 4.0–10.5)
nRBC: 0 % (ref 0.0–0.2)

## 2024-11-23 LAB — BASIC METABOLIC PANEL WITH GFR
Anion gap: 15 (ref 5–15)
BUN: 9 mg/dL (ref 6–20)
CO2: 20 mmol/L — ABNORMAL LOW (ref 22–32)
Calcium: 9.5 mg/dL (ref 8.9–10.3)
Chloride: 104 mmol/L (ref 98–111)
Creatinine, Ser: 0.58 mg/dL (ref 0.44–1.00)
GFR, Estimated: >60 mL/min
Glucose, Bld: 103 mg/dL — ABNORMAL HIGH (ref 70–99)
Potassium: 4.2 mmol/L (ref 3.5–5.1)
Sodium: 139 mmol/L (ref 135–145)

## 2024-11-23 LAB — TYPE AND SCREEN
ABO/RH(D): O POS
Antibody Screen: NEGATIVE

## 2024-11-24 MED ORDER — ACETAMINOPHEN 500 MG PO TABS
1000.0000 mg | ORAL_TABLET | ORAL | Status: AC
  Administered 2024-11-25: 1000 mg via ORAL
  Filled 2024-11-24: qty 2

## 2024-11-24 MED ORDER — CEFAZOLIN SODIUM-DEXTROSE 2-4 GM/100ML-% IV SOLN
2.0000 g | INTRAVENOUS | Status: AC
  Administered 2024-11-25: 2 g via INTRAVENOUS
  Filled 2024-11-24: qty 100

## 2024-11-24 MED ORDER — CHLORHEXIDINE GLUCONATE 0.12 % MT SOLN
15.0000 mL | Freq: Once | OROMUCOSAL | Status: AC
  Administered 2024-11-25: 15 mL via OROMUCOSAL
  Filled 2024-11-24: qty 15

## 2024-11-24 MED ORDER — ORAL CARE MOUTH RINSE: 15.0000 mL | Freq: Once | OROMUCOSAL | Status: AC

## 2024-11-24 MED ORDER — METRONIDAZOLE 500 MG/100ML IV SOLN
500.0000 mg | INTRAVENOUS | Status: AC
  Administered 2024-11-25: 500 mg via INTRAVENOUS
  Filled 2024-11-24 (×2): qty 100

## 2024-11-24 MED ORDER — POVIDONE-IODINE 10 % EX SWAB
2.0000 | Freq: Once | CUTANEOUS | Status: AC
  Administered 2024-11-25: 2 via TOPICAL

## 2024-11-24 MED ORDER — LACTATED RINGERS IV SOLN: INTRAVENOUS | Status: DC

## 2024-11-24 NOTE — Anesthesia Preprocedure Evaluation (Addendum)
"                                    Anesthesia Evaluation  Patient identified by MRN, date of birth, ID band Patient awake    Reviewed: Allergy & Precautions, NPO status , Patient's Chart, lab work & pertinent test results  History of Anesthesia Complications (+) PONV and history of anesthetic complications  Airway Mallampati: II   Neck ROM: Full    Dental no notable dental hx.    Pulmonary former smoker (quit 2022)   Pulmonary exam normal breath sounds clear to auscultation       Cardiovascular hypertension (hx gestational HTN), Normal cardiovascular exam Rhythm:Regular Rate:Normal     Neuro/Psych  Headaches PSYCHIATRIC DISORDERS Anxiety Depression       GI/Hepatic ,GERD  ,,  Endo/Other  diabetes (hx gestational DM)Hypothyroidism  Class 3 obesity  Renal/GU Renal disease (nephrolithiasis)     Musculoskeletal   Abdominal   Peds  Hematology  (+) Blood dyscrasia, anemia   Anesthesia Other Findings   Reproductive/Obstetrics                              Anesthesia Physical Anesthesia Plan  ASA: 3  Anesthesia Plan: General   Post-op Pain Management:    Induction: Intravenous  PONV Risk Score and Plan: 3 and Ondansetron , Dexamethasone  and Treatment may vary due to age or medical condition  Airway Management Planned: Oral ETT  Additional Equipment:   Intra-op Plan:   Post-operative Plan: Extubation in OR  Informed Consent: I have reviewed the patients History and Physical, chart, labs and discussed the procedure including the risks, benefits and alternatives for the proposed anesthesia with the patient or authorized representative who has indicated his/her understanding and acceptance.     Dental advisory given  Plan Discussed with: CRNA  Anesthesia Plan Comments: (Patient consented for risks of anesthesia including but not limited to:  - adverse reactions to medications - damage to eyes, teeth, lips or other  oral mucosa - nerve damage due to positioning  - sore throat or hoarseness - damage to heart, brain, nerves, lungs, other parts of body or loss of life  Informed patient about role of CRNA in peri- and intra-operative care.  Patient voiced understanding.)         Anesthesia Quick Evaluation  "

## 2024-11-25 ENCOUNTER — Ambulatory Visit
Admission: RE | Admit: 2024-11-25 | Discharge: 2024-11-25 | Disposition: A | Discharge status: Home / Self Care | Attending: Obstetrics and Gynecology | Admitting: Obstetrics and Gynecology

## 2024-11-25 ENCOUNTER — Other Ambulatory Visit: Payer: Self-pay

## 2024-11-25 ENCOUNTER — Ambulatory Visit: Payer: Self-pay | Admitting: Anesthesiology

## 2024-11-25 ENCOUNTER — Encounter: Payer: Self-pay | Admitting: Obstetrics and Gynecology

## 2024-11-25 ENCOUNTER — Encounter
Admission: RE | Disposition: A | Discharge status: Home / Self Care | Payer: Self-pay | Source: Home / Self Care | Attending: Obstetrics and Gynecology

## 2024-11-25 DIAGNOSIS — Z6841 Body Mass Index (BMI) 40.0 and over, adult: Secondary | ICD-10-CM | POA: Diagnosis not present

## 2024-11-25 DIAGNOSIS — I10 Essential (primary) hypertension: Secondary | ICD-10-CM | POA: Insufficient documentation

## 2024-11-25 DIAGNOSIS — Z8632 Personal history of gestational diabetes: Secondary | ICD-10-CM | POA: Diagnosis not present

## 2024-11-25 DIAGNOSIS — N939 Abnormal uterine and vaginal bleeding, unspecified: Secondary | ICD-10-CM | POA: Diagnosis present

## 2024-11-25 DIAGNOSIS — F32A Depression, unspecified: Secondary | ICD-10-CM | POA: Insufficient documentation

## 2024-11-25 DIAGNOSIS — N8500 Endometrial hyperplasia, unspecified: Secondary | ICD-10-CM | POA: Insufficient documentation

## 2024-11-25 DIAGNOSIS — E039 Hypothyroidism, unspecified: Secondary | ICD-10-CM | POA: Insufficient documentation

## 2024-11-25 DIAGNOSIS — F419 Anxiety disorder, unspecified: Secondary | ICD-10-CM | POA: Insufficient documentation

## 2024-11-25 DIAGNOSIS — E66813 Obesity, class 3: Secondary | ICD-10-CM | POA: Diagnosis not present

## 2024-11-25 DIAGNOSIS — Z87891 Personal history of nicotine dependence: Secondary | ICD-10-CM | POA: Diagnosis not present

## 2024-11-25 DIAGNOSIS — Z01812 Encounter for preprocedural laboratory examination: Secondary | ICD-10-CM

## 2024-11-25 LAB — POCT PREGNANCY, URINE: Preg Test, Ur: NEGATIVE

## 2024-11-25 MED ORDER — LIDOCAINE HCL (PF) 2 % IJ SOLN
INTRAMUSCULAR | Status: AC
  Filled 2024-11-25: qty 5

## 2024-11-25 MED ORDER — LACTATED RINGERS IV SOLN: INTRAVENOUS | Status: DC

## 2024-11-25 MED ORDER — SCOPOLAMINE 1 MG/3DAYS TD PT72
MEDICATED_PATCH | TRANSDERMAL | Status: AC
  Filled 2024-11-25: qty 1

## 2024-11-25 MED ORDER — DEXAMETHASONE SOD PHOSPHATE PF 10 MG/ML IJ SOLN
INTRAMUSCULAR | Status: DC | PRN
  Administered 2024-11-25: 10 mg via INTRAVENOUS

## 2024-11-25 MED ORDER — LIDOCAINE HCL (CARDIAC) PF 100 MG/5ML IV SOSY
PREFILLED_SYRINGE | INTRAVENOUS | Status: DC | PRN
  Administered 2024-11-25: 100 mg via INTRAVENOUS

## 2024-11-25 MED ORDER — FENTANYL CITRATE (PF) 100 MCG/2ML IJ SOLN
INTRAMUSCULAR | Status: AC
  Filled 2024-11-25: qty 2

## 2024-11-25 MED ORDER — OXYCODONE HCL 5 MG PO TABS
5.0000 mg | ORAL_TABLET | Freq: Four times a day (QID) | ORAL | 0 refills | Status: AC | PRN
  Filled 2024-11-25: qty 10, 3d supply, fill #0

## 2024-11-25 MED ORDER — PROPOFOL 10 MG/ML IV BOLUS
INTRAVENOUS | Status: AC
  Filled 2024-11-25: qty 20

## 2024-11-25 MED ORDER — FENTANYL CITRATE (PF) 100 MCG/2ML IJ SOLN
INTRAMUSCULAR | Status: DC | PRN
  Administered 2024-11-25 (×4): 50 ug via INTRAVENOUS

## 2024-11-25 MED ORDER — CEFAZOLIN SODIUM-DEXTROSE 2-4 GM/100ML-% IV SOLN
INTRAVENOUS | Status: AC
  Filled 2024-11-25: qty 100

## 2024-11-25 MED ORDER — GLYCOPYRROLATE 0.2 MG/ML IJ SOLN
INTRAMUSCULAR | Status: AC
  Filled 2024-11-25: qty 1

## 2024-11-25 MED ORDER — ONDANSETRON HCL 4 MG/2ML IJ SOLN
4.0000 mg | Freq: Once | INTRAMUSCULAR | Status: AC | PRN
  Administered 2024-11-25: 4 mg via INTRAVENOUS

## 2024-11-25 MED ORDER — MIDAZOLAM HCL 2 MG/2ML IJ SOLN
INTRAMUSCULAR | Status: AC
  Filled 2024-11-25: qty 2

## 2024-11-25 MED ORDER — KETOROLAC TROMETHAMINE 30 MG/ML IJ SOLN
INTRAMUSCULAR | Status: DC | PRN
  Administered 2024-11-25: 30 mg via INTRAVENOUS

## 2024-11-25 MED ORDER — INDOCYANINE GREEN 25 MG IJ SOLR
INTRAMUSCULAR | Status: DC | PRN
  Administered 2024-11-25 (×2): 25 mg

## 2024-11-25 MED ORDER — DEXAMETHASONE SOD PHOSPHATE PF 10 MG/ML IJ SOLN
INTRAMUSCULAR | Status: AC
  Filled 2024-11-25: qty 1

## 2024-11-25 MED ORDER — CHLORHEXIDINE GLUCONATE 0.12 % MT SOLN
OROMUCOSAL | Status: AC
  Filled 2024-11-25: qty 15

## 2024-11-25 MED ORDER — ACETAMINOPHEN 500 MG PO TABS
ORAL_TABLET | ORAL | Status: AC
  Filled 2024-11-25: qty 2

## 2024-11-25 MED ORDER — DEXMEDETOMIDINE HCL IN NACL 80 MCG/20ML IV SOLN
INTRAVENOUS | Status: AC
  Filled 2024-11-25: qty 20

## 2024-11-25 MED ORDER — MIDAZOLAM HCL (PF) 2 MG/2ML IJ SOLN
INTRAMUSCULAR | Status: DC | PRN
  Administered 2024-11-25: 2 mg via INTRAVENOUS

## 2024-11-25 MED ORDER — SUGAMMADEX SODIUM 200 MG/2ML IV SOLN
INTRAVENOUS | Status: DC | PRN
  Administered 2024-11-25: 200 mg via INTRAVENOUS

## 2024-11-25 MED ORDER — ONDANSETRON HCL 4 MG/2ML IJ SOLN
INTRAMUSCULAR | Status: DC | PRN
  Administered 2024-11-25: 4 mg via INTRAVENOUS

## 2024-11-25 MED ORDER — ONDANSETRON HCL 4 MG/2ML IJ SOLN
INTRAMUSCULAR | Status: AC
  Filled 2024-11-25: qty 2

## 2024-11-25 MED ORDER — KETAMINE HCL 50 MG/5ML IJ SOSY
PREFILLED_SYRINGE | INTRAMUSCULAR | Status: DC | PRN
  Administered 2024-11-25: 30 mg via INTRAVENOUS
  Administered 2024-11-25: 20 mg via INTRAVENOUS

## 2024-11-25 MED ORDER — DOCUSATE SODIUM 100 MG PO CAPS: 100.0000 mg | ORAL_CAPSULE | Freq: Two times a day (BID) | ORAL | 0 refills | Status: AC

## 2024-11-25 MED ORDER — ROCURONIUM BROMIDE 100 MG/10ML IV SOLN
INTRAVENOUS | Status: DC | PRN
  Administered 2024-11-25: 60 mg via INTRAVENOUS
  Administered 2024-11-25: 10 mg via INTRAVENOUS
  Administered 2024-11-25 (×2): 20 mg via INTRAVENOUS

## 2024-11-25 MED ORDER — ACETAMINOPHEN 10 MG/ML IV SOLN: 1000.0000 mg | Freq: Once | INTRAVENOUS | Status: DC | PRN

## 2024-11-25 MED ORDER — DROPERIDOL 2.5 MG/ML IJ SOLN
0.6250 mg | Freq: Once | INTRAMUSCULAR | Status: AC
  Administered 2024-11-25: 0.625 mg via INTRAVENOUS

## 2024-11-25 MED ORDER — STERILE WATER FOR IRRIGATION IR SOLN
Status: DC | PRN
  Administered 2024-11-25: 500 mL

## 2024-11-25 MED ORDER — PHENYLEPHRINE 80 MCG/ML (10ML) SYRINGE FOR IV PUSH (FOR BLOOD PRESSURE SUPPORT)
PREFILLED_SYRINGE | INTRAVENOUS | Status: DC | PRN
  Administered 2024-11-25: 80 ug via INTRAVENOUS

## 2024-11-25 MED ORDER — PROPOFOL 10 MG/ML IV BOLUS
INTRAVENOUS | Status: DC | PRN
  Administered 2024-11-25: 200 mg via INTRAVENOUS

## 2024-11-25 MED ORDER — ACETAMINOPHEN EXTRA STRENGTH 500 MG PO TABS: 1000.0000 mg | ORAL_TABLET | Freq: Four times a day (QID) | ORAL | 0 refills | Status: AC

## 2024-11-25 MED ORDER — PROPOFOL 1000 MG/100ML IV EMUL
INTRAVENOUS | Status: AC
  Filled 2024-11-25: qty 100

## 2024-11-25 MED ORDER — DEXMEDETOMIDINE HCL IN NACL 80 MCG/20ML IV SOLN
INTRAVENOUS | Status: DC | PRN
  Administered 2024-11-25: 8 ug via INTRAVENOUS
  Administered 2024-11-25: 4 ug via INTRAVENOUS

## 2024-11-25 MED ORDER — SCOPOLAMINE 1 MG/3DAYS TD PT72
1.0000 | MEDICATED_PATCH | TRANSDERMAL | Status: DC
  Administered 2024-11-25: 1 mg via TRANSDERMAL

## 2024-11-25 MED ORDER — KETAMINE HCL 50 MG/5ML IJ SOSY
PREFILLED_SYRINGE | INTRAMUSCULAR | Status: AC
  Filled 2024-11-25: qty 5

## 2024-11-25 MED ORDER — GABAPENTIN 300 MG PO CAPS: 300.0000 mg | ORAL_CAPSULE | Freq: Every day | ORAL | 0 refills | Status: AC

## 2024-11-25 MED ORDER — OXYCODONE HCL 5 MG PO TABS: 5.0000 mg | ORAL_TABLET | Freq: Four times a day (QID) | ORAL | 0 refills | Status: AC | PRN

## 2024-11-25 MED ORDER — OXYCODONE HCL 5 MG/5ML PO SOLN: 5.0000 mg | Freq: Once | ORAL | Status: AC | PRN

## 2024-11-25 MED ORDER — CELECOXIB 200 MG PO CAPS: 200.0000 mg | ORAL_CAPSULE | Freq: Two times a day (BID) | ORAL | 0 refills | Status: AC

## 2024-11-25 MED ORDER — SODIUM CHLORIDE 0.9 % IR SOLN
Status: DC | PRN
  Administered 2024-11-25: 3000 mL via INTRAVESICAL

## 2024-11-25 MED ORDER — 0.9 % SODIUM CHLORIDE (POUR BTL) OPTIME
TOPICAL | Status: DC | PRN
  Administered 2024-11-25: 500 mL

## 2024-11-25 MED ORDER — ROCURONIUM BROMIDE 10 MG/ML (PF) SYRINGE
PREFILLED_SYRINGE | INTRAVENOUS | Status: AC
  Filled 2024-11-25: qty 10

## 2024-11-25 MED ORDER — FENTANYL CITRATE (PF) 100 MCG/2ML IJ SOLN
25.0000 ug | INTRAMUSCULAR | Status: DC | PRN
  Administered 2024-11-25: 50 ug via INTRAVENOUS
  Administered 2024-11-25 (×2): 25 ug via INTRAVENOUS

## 2024-11-25 MED ORDER — PHENYLEPHRINE HCL-NACL 20-0.9 MG/250ML-% IV SOLN
INTRAVENOUS | Status: AC
  Filled 2024-11-25: qty 250

## 2024-11-25 MED ORDER — BUPIVACAINE HCL (PF) 0.5 % IJ SOLN
INTRAMUSCULAR | Status: AC
  Filled 2024-11-25: qty 30

## 2024-11-25 MED ORDER — PROPOFOL 500 MG/50ML IV EMUL
INTRAVENOUS | Status: DC | PRN
  Administered 2024-11-25: 150 ug/kg/min via INTRAVENOUS

## 2024-11-25 MED ORDER — OXYCODONE HCL 5 MG PO TABS
5.0000 mg | ORAL_TABLET | Freq: Once | ORAL | Status: AC | PRN
  Administered 2024-11-25: 5 mg via ORAL

## 2024-11-25 MED ORDER — PHENYLEPHRINE HCL-NACL 20-0.9 MG/250ML-% IV SOLN
INTRAVENOUS | Status: DC | PRN
  Administered 2024-11-25: 30 ug/min via INTRAVENOUS

## 2024-11-25 MED ORDER — DROPERIDOL 2.5 MG/ML IJ SOLN
INTRAMUSCULAR | Status: AC
  Filled 2024-11-25: qty 2

## 2024-11-25 MED ORDER — BUPIVACAINE HCL (PF) 0.5 % IJ SOLN
INTRAMUSCULAR | Status: DC | PRN
  Administered 2024-11-25: 10 mL

## 2024-11-25 MED ORDER — OXYCODONE HCL 5 MG PO TABS
ORAL_TABLET | ORAL | Status: AC
  Filled 2024-11-25: qty 1

## 2024-11-25 MED ORDER — GLYCOPYRROLATE 0.2 MG/ML IJ SOLN
INTRAMUSCULAR | Status: DC | PRN
  Administered 2024-11-25: .1 mg via INTRAVENOUS

## 2024-11-25 NOTE — Anesthesia Procedure Notes (Signed)
 Procedure Name: Intubation Date/Time: 11/25/2024 7:50 AM  Performed by: Myra Lawless, CRNAPre-anesthesia Checklist: Patient identified, Patient being monitored, Timeout performed, Emergency Drugs available and Suction available Patient Re-evaluated:Patient Re-evaluated prior to induction Oxygen Delivery Method: Circle system utilized Preoxygenation: Pre-oxygenation with 100% oxygen Induction Type: IV induction Ventilation: Mask ventilation without difficulty Laryngoscope Size: Mac, 4 and McGrath Grade View: Grade I Tube type: Oral Tube size: 7.0 mm Number of attempts: 1 Airway Equipment and Method: Stylet and Video-laryngoscopy Placement Confirmation: ETT inserted through vocal cords under direct vision, positive ETCO2 and breath sounds checked- equal and bilateral Secured at: 21 cm Tube secured with: Tape Dental Injury: Teeth and Oropharynx as per pre-operative assessment

## 2024-11-25 NOTE — H&P (Signed)
 "  11/25/24 7:01 AM   Desiree Santos [birth year] [id number]  CC: Request for ureteral ICG  HPI: 39 year old female here for laparoscopic hysterectomy with Dr. Verdon for endometrial hyperplasia.  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   PMH: Past Medical History:  Diagnosis Date   Abnormal uterine bleeding    Abnormal uterine bleeding (AUB)    Anemia    Chicken pox    COVID-19 05/26/2023   Depression    Elevated BP without diagnosis of hypertension    Endometrial hyperplasia    GERD (gastroesophageal reflux disease)    Gestational diabetes    Headache    History of kidney stones    History of UTI    Hypertension    during pregnancy   Hypothyroidism    Thyroid  disease     Surgical History: Past Surgical History:  Procedure Laterality Date   CERVICAL POLYPECTOMY N/A 08/26/2024   Procedure: POLYPECTOMY, CERVIX;  Surgeon: Verdon Keen, MD;  Location: [facility];  Service: Gynecology;  Laterality: N/A;   CHOLECYSTECTOMY     DILATION AND CURETTAGE OF UTERUS N/A 05/06/2023   Procedure: SUCTION DILATATION AND CURETTAGE;  Surgeon: Verdon Keen, MD;  Location: [facility];  Service: Gynecology;  Laterality: N/A;   HYSTEROSCOPY WITH D & C N/A 08/26/2024   Procedure: DILATATION AND CURETTAGE /HYSTEROSCOPY;  Surgeon: Verdon Keen, MD;  Location: [facility];  Service: Gynecology;  Laterality: N/A;   KIDNEY SURGERY  2002   h/o kidney reflux?   LAPAROSCOPIC TUBAL LIGATION Bilateral 06/08/2023   Procedure: LAPAROSCOPIC TUBAL LIGATION;  Surgeon: Verdon Keen, MD;  Location: [facility];  Service: Gynecology;  Laterality: Bilateral;   TONSILLECTOMY     WISDOM TOOTH EXTRACTION Right 10/20/2022     Family History: Family History  Problem Relation Age of Onset   Hyperlipidemia Mother    Hypertension Mother    Heart disease Father    Sudden Cardiac Death Father        age 17    Arthritis Maternal Grandmother    Cancer Maternal Grandmother    Depression Maternal Grandmother    Hypertension Maternal Grandmother    Dementia Maternal Grandmother    Hypertension Maternal Grandfather    Early death Paternal Grandmother    Cancer Paternal Grandmother    Breast cancer Paternal Grandmother        thinks in her 24's   Diabetes Paternal Grandfather    Cancer Paternal Grandfather    Hypertension Paternal Grandfather    Hyperlipidemia Paternal Grandfather    Heart disease Paternal Grandfather    Early death Paternal Grandfather    Depression Paternal Grandfather     Social History:  reports that she quit smoking about 4 years ago. Her smoking use included cigarettes. She has never used smokeless tobacco. She reports that she does not drink alcohol and does not use drugs.  Physical Exam: BP (!) 141/94   Pulse 92   Temp (!) 96.6 F (35.9 C) (Temporal)   Resp 16   Ht 5' 2 (1.575 m)   Wt 104.3 kg   LMP 10/31/2024 (Exact Date)   SpO2 99%   BMI 42.06 kg/m    Constitutional:  Alert and oriented, No acute distress. Cardiovascular: Regular rate and rhythm Respiratory: Clear to auscultation bilaterally GI: Abdomen is soft, nontender, nondistended, no abdominal masses  Assessment & Plan:   39 year old female here for laparoscopic hysterectomy with Dr. Verdon for  endometrial hyperplasia.  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  We discussed the risk of bleeding, infection, ureteral injury, hematuria, acute kidney injury, possible inability to instill ICG, ureteral injury and possible need for repair.  Cystoscopy and bilateral ureteral ICG  Redell Burnet, MD 11/25/2024  [facility] [address] [place], [place] [address] [phone]   "

## 2024-11-25 NOTE — Op Note (Signed)
 Desiree Santos PROCEDURE DATE: 11/25/2024  PREOPERATIVE DIAGNOSIS: Focal endometrial hyperplasia without atypia ina polyp POSTOPERATIVE DIAGNOSIS: The same PROCEDURE:  Panel 1 HYSTERECTOMY, TOTAL, LAPAROSCOPIC, ROBOT-ASSISTED WITH SALPINGECTOMY: 58571 (CPT)  Panel 2 CYSTOSCOPY WITH INDOCYANINE GREEN  IMAGING (ICG): 52005 (CPT)   SURGEON:  Dr. Heather Penton, MD ASSISTANT: Dr. Garnette Mace, MD  Anesthesiologist:  Anesthesiologist: Shellie Odor, MD CRNA: Myra Lawless, CRNA; Lacretia Camelia NOVAK, CRNA  An experienced assistant was required given the standard of surgical care given the complexity of the case.  This assistant was needed for exposure, dissection, suctioning, retraction, instrument exchange: Dr. Mace provided expert assistance required for safety during the procedure.   INDICATIONS: 39 y.o. F  here for definitive surgical management secondary to the indications listed under preoperative diagnoses; please see preoperative note for further details.   She has a hx of thickened ES polyp and AUB, with a D&C finding focal endometrial hyperplasia without atypia   She does have a hx of ureter reimplantation as a child. She has seen urology and they will come for retrograde ureteral ICG  Risks of surgery were discussed with the patient including but not limited to: bleeding which may require transfusion or reoperation; infection which may require antibiotics; injury to bowel, bladder, ureters or other surrounding organs; need for additional procedures; thromboembolic phenomenon, incisional problems and other postoperative/anesthesia complications. Written informed consent was obtained.    FINDINGS:    External genitalia, vaginal canal and cervix negative for lesions. Intraoperative findings revealed a normal upper abdomen including bowel, liver, diaphragmatic surfaces, stomach, and omentum, with omental adhesions in several places in the upper abdomen.  The uterus was  small and mobile, with truncated fallopian tubes. The right and left ovaries appeared normal.   Appendix wnl  The bladder and ureters were visualized easily after instillation of the icg by urology.  ANESTHESIA:    General INTRAVENOUS FLUIDS:700  ml ESTIMATED BLOOD LOSS:10 ml URINE OUTPUT: 100 ml  SPECIMENS: Uterus, cervix, bilateral fallopian tube remnant  COMPLICATIONS: None immediate   RATLH/BS:  PROCEDURE IN DETAIL: After informed consent was obtained, the patient was taken to the operating room where general anesthesia was obtained without difficulty. The patient was positioned in the dorsal lithotomy position in [place] stirrups and her arms were carefully tucked at her sides and the usual precautions were taken. Deep Trendelenburg (20-25 deg) was established to confirm that she does not shift on the table.  She was prepped and draped in normal sterile fashion.  Time-out was performed and a Foley catheter was placed into the bladder. A large VCare uterine manipulator was then placed in the uterus without incident.  Preoperative prophylactic antibiotics were given through her iv.  After infiltration of local anesthetic at the proposed trocar sites, an 8 mm incision was created at the umbilicus, and an AirSeal 5mm was placed under direct visualization, after confirmation of OG tube working well. Pneumoperitoneum was created to a pressure of 15 mm Hg. The camera was placed and the abdomin surveyed, noting intact bowel below the site of entry. A survey of the pelvis and upper abdomen revealed the above findings. One right and one left lateral 8-mm robotic ports were placed under direct visualization.  The patient was placed in deepTrendelenburg and the bowel was displaced up into the upper abdomen. The robot was left side docked. The instruments were placed under direct visualization.   The ureters were identified bilaterally coursing outside of the operative field. Round ligaments were  divided on each side  with the EndoShears and the retroperitoneal space was opened bilaterally. The posterior leaflet of the broad was taken down to the level of the IP ligament. The anterior leaflet of the broad ligament was carefully taken down to the midline.  A bladder flap was created and the bladder was dissected down off the lower uterine segment and cervix using endoshears and electrocautery.   The Fallopian tubes were divided from the ovaries, and care taken to hemostatically transect the utero-ovarian ligament. The peritoneum was taken down to the level of the internal os, and the uterine arteries skeletonized. With strong cephalad pressure from the V-care, bipolar cautery was used to seal and transect the uterine arteries, and the pedicles allowed to fall away laterally.  A colpotomy was performed circumferentially along the V-Care ring with monopolar electrocautery and the cervix was incised from the vagina using the laparoscopic scissors. The specimen was removed through the vagina.  A pneumo balloon was placed in the vagina and the vaginal cuff was then closed in a running continuous fashion using the  0 barbed PDS suture with careful attention to include the vaginal cuff angles, the uterosacral ligaments and the vaginal mucosa within the closure.  Hemostasis was secured with intraabdominal pressure and review of all surgical sites. The intraperitoneal pressure was dropped, and all planes of dissection, vascular pedicles and the vaginal cuff were found to be hemostatic.  The robot was undocked.   The lateral trocars were removed under visualization.  The CO2 gas was released and several deep breaths given to remove any remaining CO2 from the peritoneal cavity.  The skin incisions were closed with 4-0 Monocryl subcuticular stitch and Dermabond.    Anesthesia was reversed without difficulty.  The patient tolerated the procedure well.  Sponge, lap and needle counts were correct x2.  The  patient was taken to recovery room in excellent condition.

## 2024-11-25 NOTE — Anesthesia Postprocedure Evaluation (Signed)
"   Anesthesia Post Note  Patient: Desiree Santos  Procedure(s) Performed: HYSTERECTOMY, TOTAL, LAPAROSCOPIC, ROBOT-ASSISTED WITH SALPINGECTOMY (Bilateral: Uterus) CYSTOSCOPY WITH INDOCYANINE GREEN  IMAGING (ICG) (Bilateral: Ureter)  Patient location during evaluation: PACU Anesthesia Type: General Level of consciousness: awake and alert, oriented and patient cooperative Pain management: pain level controlled Vital Signs Assessment: post-procedure vital signs reviewed and stable Respiratory status: spontaneous breathing, nonlabored ventilation and respiratory function stable Cardiovascular status: blood pressure returned to baseline and stable Postop Assessment: adequate PO intake Anesthetic complications: no   No notable events documented.   Last Vitals:  Vitals:   11/25/24 1046 11/25/24 1100  BP: 102/67 (!) 111/59  Pulse: (!) 107 91  Resp: 17 13  Temp:    SpO2: 96% 95%    Last Pain:  Vitals:   11/25/24 1048  TempSrc:   PainSc: [address]      "

## 2024-11-25 NOTE — Op Note (Signed)
 Date of procedure: 11/25/24  Preoperative diagnosis:  Endometrial hyperplasia Request for ureteral ICG  Postoperative diagnosis:  Same  Procedure: Cystoscopy, bilateral ureteral catheterization and instillation of ICG  Surgeon: Redell Burnet, MD  Anesthesia: General  Complications: None  Intraoperative findings:  No suspicious bladder lesions Mounded and irregular trigone from prior bilateral ureteral reimplant Able to identify pinpoint and angulated ureteral orifices bilaterally, challenging but able to cannulate with sensor wire, advance 5 French access catheter, and instill ICG bilaterally  EBL: None for urology portion  Specimens: None for urology portion  Drains: 16 French Foley  Indication: Desiree Santos is a 39 y.o. patient with endometrial hyperplasia here for hysterectomy with GYN today, she also has a history of a bilateral ureteral reimplant as a teenager for reflux, urology was requested to instill bilateral ureteral ICG.    After reviewing the management options for treatment, they elected to proceed with the above surgical procedure(s). We have discussed the potential benefits and risks of the procedure, side effects of the proposed treatment, the likelihood of the patient achieving the goals of the procedure, and any potential problems that might occur during the procedure or recuperation. Informed consent has been obtained.  Description of procedure:  The patient was taken to the operating room and general anesthesia was induced. SCDs were placed for DVT prophylaxis. The patient was placed in the dorsal lithotomy position, prepped and draped in the usual sterile fashion, and preoperative antibiotics were administered. A preoperative time-out was performed.   A 21 French rigid cystoscope was used to intubate the urethra and thorough cystoscopy was performed.  The bladder was grossly normal with no suspicious lesions.  The trigone was irregular and mounded  consistent with prior bilateral ureteral reimplant.  Ultimately I was able to identify pinpoint ureteral orifices medially on the trigone, these were irregular and not in standard anatomic position with some angulation laterally and posteriorly.  I started on the left side and with the aid of a sensor wire and access catheter was able to gently cannulate the orifice, the 5 French access catheter was then gently advanced into the orifice and the wire further advanced.  The 5 French access catheter was advanced approximately 5 cm and 10 mL ICG was gently instilled.  I then turned my attention to the right side and again gently cannulated a pinpoint orifice with angulation laterally and posteriorly with a sensor wire, gently advance a 5 French access catheter over and was ultimately able to advance a sensor wire further up the ureter, and advance the 5 French access catheter 5 cm into the ureter.  A total of 10 mL ICG was gently instilled.  A 16 French Foley was placed with return of green fluid and 10 mL were placed in the balloon.  The catheter was clamped to facilitate staining of the bladder mucosa, and will be unclamped in 10 to 15 minutes by the GYN team.  Disposition: Continue to GYN portion of case  Plan: Continue to GYN portion of case, Foley duration per GYN team  Redell Burnet, MD

## 2024-11-25 NOTE — Discharge Instructions (Signed)
Discharge instructions after  robotically-assisted total laparoscopic hysterectomy   For the next three days, take ibuprofen and acetaminophen on a schedule, every 8 hours. You can take them together or you can intersperse them, and take one every four hours. I also gave you gabapentin for nighttime, to help you sleep and also to control pain. Take gabapentin medicines at night for at least the next 3 nights. You also have a narcotic, oxycodone, to take as needed if the above medicines don't help.  Postop constipation is a major cause of pain. Stay well hydrated, walk as you tolerate, and take over the counter senna as well as stool softeners if you need them.   Signs and Symptoms to Report Call our office at [phone] if you have any of the following.   Fever over 100.4 degrees or higher  Severe stomach pain not relieved with pain medications  Bright red bleeding that's heavier than a period that does not slow with rest  To go the bathroom a lot (frequency), you can't hold your urine (urgency), or it hurts when you empty your bladder (urinate)  Chest pain  Shortness of breath  Pain in the calves of your legs  Severe nausea and vomiting not relieved with anti-nausea medications  Signs of infection around your wounds, such as redness, hot to touch, swelling, green/yellow drainage (like pus), bad smelling discharge  Any concerns  What You Can Expect after Surgery  You may see some pink tinged, bloody fluid and bruising around the wound. This is normal.  You may notice shoulder and neck pain. This is caused by the gas used during surgery to expand your abdomen so your surgeon could get to the uterus easier.  You may have a sore throat because of the tube in your mouth during general anesthesia. This will go away in 2 to 3 days.  You may have some stomach cramps.  You may notice spotting on your panties.  You may have pain around the incision sites.   Activities after Your  Discharge Follow these guidelines to help speed your recovery at home:  Do the coughing and deep breathing as you did in the hospital for 2 weeks. Use the small blue breathing device, called the incentive spirometer for 2 weeks.  Don't drive if you are in pain or taking narcotic pain medicine. You may drive when you can safely slam on the brakes, turn the wheel forcefully, and rotate your torso comfortably. This is typically 1-2 weeks. Practice in a parking lot or side street prior to attempting to drive regularly.   Ask others to help with household chores for 4 weeks.  Do not lift anything heavier that 10 pounds for 4-6 weeks. This includes pets, children, and groceries.  Don't do strenuous activities, exercises, or sports like vacuuming, tennis, squash, etc. until your doctor says it is safe to do so. ---Maintain pelvic rest for 12 weeks. This means nothing in the vagina or rectum at all (no douching, tampons, intercourse) for 12 weeks.   Walk as you feel able. Rest often since it may take two or three weeks for your energy level to return to normal.   You may climb stairs  Avoid constipation:   -Eat fruits, vegetables, and whole grains. Eat small meals as your appetite will take time to return to normal.   -Drink 6 to 8 glasses of water each day unless your doctor has told you to limit your fluids.   -Use a laxative or  stool softener as needed if constipation becomes a problem. You may take Miralax, metamucil, Citrucil, Colace, Senekot, FiberCon, etc. If this does not relieve the constipation, try two tablespoons of Milk Of Magnesia every 8 hours until your bowels move.   You may shower. Gently wash the wounds with a mild soap and water. Pat dry.  Do not get in a hot tub, swimming pool, etc. for 6 weeks.  Do not use lotions, oils, powders on the wounds.  Do not douche, use tampons, or have sex until your doctor says it is okay.  Take your pain medicine when you need it. The medicine may not  work as well if the pain is bad.  Take the medicines you were taking before surgery. Other medications you will need are pain medications (Norco or Percocet) and nausea medications (Zofran).

## 2024-11-26 ENCOUNTER — Encounter: Payer: Self-pay | Admitting: Obstetrics and Gynecology

## 2024-11-30 LAB — SURGICAL PATHOLOGY
